# Patient Record
Sex: Female | Born: 1960 | Race: Black or African American | Hispanic: No | Marital: Married | Smoking: Never smoker
Health system: Southern US, Community
[De-identification: ages and names within clinical notes are randomized; demographics above are authoritative.]

## PROBLEM LIST (undated history)

## (undated) DIAGNOSIS — R011 Cardiac murmur, unspecified: Secondary | ICD-10-CM

## (undated) DIAGNOSIS — D649 Anemia, unspecified: Secondary | ICD-10-CM

## (undated) DIAGNOSIS — D219 Benign neoplasm of connective and other soft tissue, unspecified: Secondary | ICD-10-CM

## (undated) DIAGNOSIS — Z5189 Encounter for other specified aftercare: Secondary | ICD-10-CM

## (undated) HISTORY — DX: Cardiac murmur, unspecified: R01.1

## (undated) HISTORY — PX: TONSILLECTOMY: SUR1361

---

## 2006-11-19 ENCOUNTER — Other Ambulatory Visit: Admission: RE | Admit: 2006-11-19 | Discharge: 2006-11-19 | Payer: Self-pay | Admitting: Obstetrics and Gynecology

## 2006-11-19 ENCOUNTER — Encounter (INDEPENDENT_AMBULATORY_CARE_PROVIDER_SITE_OTHER): Payer: Self-pay | Admitting: Specialist

## 2006-11-19 ENCOUNTER — Ambulatory Visit: Payer: Self-pay | Admitting: Obstetrics and Gynecology

## 2006-11-21 ENCOUNTER — Ambulatory Visit (HOSPITAL_COMMUNITY): Admission: RE | Admit: 2006-11-21 | Discharge: 2006-11-21 | Payer: Self-pay | Admitting: Obstetrics and Gynecology

## 2006-11-28 ENCOUNTER — Ambulatory Visit (HOSPITAL_COMMUNITY): Admission: RE | Admit: 2006-11-28 | Discharge: 2006-11-28 | Payer: Self-pay | Admitting: Obstetrics & Gynecology

## 2006-12-26 ENCOUNTER — Ambulatory Visit: Payer: Self-pay | Admitting: Obstetrics and Gynecology

## 2008-10-05 ENCOUNTER — Ambulatory Visit: Payer: Self-pay | Admitting: Obstetrics and Gynecology

## 2008-10-05 LAB — CONVERTED CEMR LAB
HCT: 24.2 % — ABNORMAL LOW (ref 36.0–46.0)
MCV: 58.5 fL — ABNORMAL LOW (ref 78.0–100.0)
Platelets: 196 10*3/uL (ref 150–400)
RBC: 4.14 M/uL (ref 3.87–5.11)
RDW: 22.3 % — ABNORMAL HIGH (ref 11.5–15.5)

## 2008-10-06 ENCOUNTER — Observation Stay (HOSPITAL_COMMUNITY): Admission: AD | Admit: 2008-10-06 | Discharge: 2008-10-07 | Payer: Self-pay | Admitting: Obstetrics & Gynecology

## 2008-10-10 ENCOUNTER — Ambulatory Visit (HOSPITAL_COMMUNITY): Admission: RE | Admit: 2008-10-10 | Discharge: 2008-10-10 | Payer: Self-pay | Admitting: Obstetrics & Gynecology

## 2008-10-13 ENCOUNTER — Ambulatory Visit: Payer: Self-pay | Admitting: Obstetrics and Gynecology

## 2008-10-13 LAB — CONVERTED CEMR LAB
HCT: 34.7 % — ABNORMAL LOW (ref 36.0–46.0)
Platelets: 337 10*3/uL (ref 150–400)
RDW: 30.9 % — ABNORMAL HIGH (ref 11.5–15.5)
WBC: 8.7 10*3/uL (ref 4.0–10.5)

## 2008-10-27 ENCOUNTER — Ambulatory Visit (HOSPITAL_COMMUNITY): Admission: RE | Admit: 2008-10-27 | Discharge: 2008-10-27 | Payer: Self-pay | Admitting: Obstetrics and Gynecology

## 2008-10-27 ENCOUNTER — Ambulatory Visit: Payer: Self-pay | Admitting: Obstetrics and Gynecology

## 2009-06-05 ENCOUNTER — Emergency Department (HOSPITAL_COMMUNITY): Admission: EM | Admit: 2009-06-05 | Discharge: 2009-06-05 | Payer: Self-pay | Admitting: Family Medicine

## 2010-04-12 ENCOUNTER — Ambulatory Visit: Payer: Self-pay | Admitting: Obstetrics and Gynecology

## 2010-04-13 ENCOUNTER — Encounter: Payer: Self-pay | Admitting: Obstetrics and Gynecology

## 2010-04-13 LAB — CONVERTED CEMR LAB
HCT: 24.5 % — ABNORMAL LOW (ref 36.0–46.0)
Hemoglobin: 5.7 g/dL — CL (ref 12.0–15.0)
Platelets: 325 10*3/uL (ref 150–400)
RBC: 4.07 M/uL (ref 3.87–5.11)
WBC: 7.3 10*3/uL (ref 4.0–10.5)

## 2010-08-11 ENCOUNTER — Observation Stay (HOSPITAL_COMMUNITY)
Admission: AD | Admit: 2010-08-11 | Discharge: 2010-08-12 | Payer: Self-pay | Source: Home / Self Care | Admitting: Obstetrics & Gynecology

## 2010-08-27 ENCOUNTER — Ambulatory Visit: Payer: Self-pay | Admitting: Obstetrics and Gynecology

## 2010-09-13 ENCOUNTER — Encounter (INDEPENDENT_AMBULATORY_CARE_PROVIDER_SITE_OTHER): Payer: Self-pay | Admitting: *Deleted

## 2010-09-13 ENCOUNTER — Ambulatory Visit: Payer: Self-pay | Admitting: Obstetrics & Gynecology

## 2010-09-13 LAB — CONVERTED CEMR LAB
HCT: 34.7 % — ABNORMAL LOW (ref 36.0–46.0)
MCHC: 30.3 g/dL (ref 30.0–36.0)
MCV: 80.7 fL (ref 78.0–100.0)
Platelets: 226 10*3/uL (ref 150–400)

## 2011-02-07 LAB — CBC
HCT: 17.5 % — ABNORMAL LOW (ref 36.0–46.0)
MCH: 24.2 pg — ABNORMAL LOW (ref 26.0–34.0)
MCHC: 30.5 g/dL (ref 30.0–36.0)
MCV: 77 fL — ABNORMAL LOW (ref 78.0–100.0)
Platelets: 256 10*3/uL (ref 150–400)
RBC: 2.64 MIL/uL — ABNORMAL LOW (ref 3.87–5.11)
RBC: 3.67 MIL/uL — ABNORMAL LOW (ref 3.87–5.11)
RDW: 18.6 % — ABNORMAL HIGH (ref 11.5–15.5)
RDW: 26.2 % — ABNORMAL HIGH (ref 11.5–15.5)
WBC: 7.6 10*3/uL (ref 4.0–10.5)

## 2011-02-07 LAB — CROSSMATCH
ABO/RH(D): B POS
Antibody Screen: NEGATIVE

## 2011-02-07 LAB — HCG, SERUM, QUALITATIVE: Preg, Serum: NEGATIVE

## 2011-02-07 LAB — POCT PREGNANCY, URINE: Preg Test, Ur: NEGATIVE

## 2011-03-03 LAB — DIFFERENTIAL
Basophils Absolute: 0 10*3/uL (ref 0.0–0.1)
Eosinophils Absolute: 0.1 10*3/uL (ref 0.0–0.7)
Lymphocytes Relative: 25 % (ref 12–46)
Neutrophils Relative %: 65 % (ref 43–77)

## 2011-03-03 LAB — CBC
Platelets: 310 10*3/uL (ref 150–400)
RBC: 4.48 MIL/uL (ref 3.87–5.11)
RDW: 21.2 % — ABNORMAL HIGH (ref 11.5–15.5)
WBC: 5.9 10*3/uL (ref 4.0–10.5)

## 2011-03-03 LAB — D-DIMER, QUANTITATIVE: D-Dimer, Quant: 0.42 ug/mL-FEU (ref 0.00–0.48)

## 2011-03-21 ENCOUNTER — Ambulatory Visit (INDEPENDENT_AMBULATORY_CARE_PROVIDER_SITE_OTHER): Payer: Medicaid Other | Admitting: Obstetrics & Gynecology

## 2011-03-21 ENCOUNTER — Other Ambulatory Visit: Payer: Self-pay | Admitting: Obstetrics & Gynecology

## 2011-03-21 DIAGNOSIS — Z01818 Encounter for other preprocedural examination: Secondary | ICD-10-CM

## 2011-03-21 DIAGNOSIS — D259 Leiomyoma of uterus, unspecified: Secondary | ICD-10-CM

## 2011-03-21 DIAGNOSIS — D219 Benign neoplasm of connective and other soft tissue, unspecified: Secondary | ICD-10-CM

## 2011-03-21 DIAGNOSIS — N920 Excessive and frequent menstruation with regular cycle: Secondary | ICD-10-CM

## 2011-03-22 NOTE — Group Therapy Note (Signed)
NAME:  Katherine Harvey, SKEEN NO.:  [id number]  MEDICAL RECORD NO.:  [id number]           PATIENT TYPE:  A  LOCATION[facility]  [facility]                   FACILITY:  [facility]  PHYSICIAN:  Jaynie Collins, MD     DATE OF BIRTH:  1961/10/09  DATE OF SERVICE:  03/21/2011                                 CLINIC NOTE  REASON FOR VISIT:  Followup fibroids.  HISTORY OF PRESENT ILLNESS:  The patient is a 50 year old gravida 1, para 1 with a long history of dysfunctional uterine bleeding and uterine fibroids.  She was evaluated by Dr. Scheryl Darter in October 2011.  Prior to that visit in September, she did have an ultrasound which showed a 17- week size fibroid uterus, largest fibroid was noted to measure 7.3-cm and a left fundal one measured 5.5-cm.  The patient also was admitted for symptomatic anemia in September as she was transitioned 2 units of blood and started on [place].  At her last visit, the patient was counseled regarding hysterectomy given the size of her fibroids and her symptoms.  The patient did want to postpone surgery due to child care issues and wanted to continue on oral contraceptives.  At this visit, she does decide that she wants to proceed with surgery.  Initially, the patient did want to have a myomectomy and she was told that this is not indicated given her age.  She is done with childbearing and the size of her fibroids.  When we discussed different modalities of surgery and the risks and benefits of bilateral salpingo-oophorectomy and also discussed whether she wanted her cervix removed or not (the patient has no history of cervical dysplasia).  The patient decided that she wanted a supracervical hysterectomy without bilateral salpingo-oophorectomy.  The risks of surgery were reviewed with the patient including bleeding, infection, injury to surrounding organs and need for additional procedures.  All her questions were answered.  She was given an up-to- date  patient education pamphlet about abdominal hysterectomy.  The patient was told that it is very likely that the surgery might be done with a transverse incision, but if her uterine fibroids have gotten bigger, we might have to do a infraumbilical vertical incisions in order to help with reducing the fibroid size.  The patient was offered a Depo- Lupron which she accepted.  The risks of this were reviewed.  She was told to expect withdrawal bleeding, especially within the first 3 weeks on the Lupron, but she should continue her Seasonique for the first month and then discontinue her Seasonique for the last 2 months' as the Lupron will be in for 3 months.  If she does have a lot of menopausal symptoms on the Lupron, we could treat her at that point with a little bit of norethindrone 5 mg daily, but if not she will probably be done with her surgery around that time.  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  Today, we will check a CBC, a TSH and a prolactin level.  We will give her Depo-Lupron 11.25 mg and also schedule another ultrasound to see if her basically where we are going for a baseline from when we started Depo-Lupron.  We will see based on examination if we need to repeat imaging prior to surgery.  Of note, the patient did have an endometrial biopsy that was done in October that was benign.          ______________________________ Jaynie Collins, MD   UA/MEDQ  D:  03/21/2011  T:  03/22/2011  Job:  [id number]

## 2011-04-01 ENCOUNTER — Other Ambulatory Visit: Payer: Self-pay | Admitting: Obstetrics & Gynecology

## 2011-04-01 ENCOUNTER — Ambulatory Visit (HOSPITAL_COMMUNITY)
Admission: RE | Admit: 2011-04-01 | Discharge: 2011-04-01 | Disposition: A | Discharge status: Home / Self Care | Payer: Self-pay | Source: Ambulatory Visit | Attending: Obstetrics & Gynecology | Admitting: Obstetrics & Gynecology

## 2011-04-01 ENCOUNTER — Ambulatory Visit (HOSPITAL_COMMUNITY): Payer: Self-pay

## 2011-04-01 DIAGNOSIS — Z1231 Encounter for screening mammogram for malignant neoplasm of breast: Secondary | ICD-10-CM

## 2011-04-01 DIAGNOSIS — D259 Leiomyoma of uterus, unspecified: Secondary | ICD-10-CM | POA: Insufficient documentation

## 2011-04-01 DIAGNOSIS — D219 Benign neoplasm of connective and other soft tissue, unspecified: Secondary | ICD-10-CM

## 2011-04-01 DIAGNOSIS — N920 Excessive and frequent menstruation with regular cycle: Secondary | ICD-10-CM | POA: Insufficient documentation

## 2011-04-03 ENCOUNTER — Other Ambulatory Visit: Payer: Self-pay | Admitting: Diagnostic Radiology

## 2011-04-03 DIAGNOSIS — D219 Benign neoplasm of connective and other soft tissue, unspecified: Secondary | ICD-10-CM

## 2011-04-08 ENCOUNTER — Ambulatory Visit (HOSPITAL_COMMUNITY)
Admission: RE | Admit: 2011-04-08 | Discharge: 2011-04-08 | Disposition: A | Discharge status: Home / Self Care | Payer: Self-pay | Source: Ambulatory Visit | Attending: Obstetrics & Gynecology | Admitting: Obstetrics & Gynecology

## 2011-04-08 DIAGNOSIS — Z1231 Encounter for screening mammogram for malignant neoplasm of breast: Secondary | ICD-10-CM

## 2011-04-09 ENCOUNTER — Other Ambulatory Visit (HOSPITAL_COMMUNITY): Payer: Self-pay | Admitting: Diagnostic Radiology

## 2011-04-09 ENCOUNTER — Ambulatory Visit
Admission: RE | Admit: 2011-04-09 | Discharge: 2011-04-09 | Disposition: A | Discharge status: Home / Self Care | Payer: Medicaid Other | Source: Ambulatory Visit | Attending: Diagnostic Radiology | Admitting: Diagnostic Radiology

## 2011-04-09 VITALS — BP 139/59 | HR 59 | Temp 97.9°F | Resp 14 | Ht 67.5 in | Wt 234.0 lb

## 2011-04-09 DIAGNOSIS — D219 Benign neoplasm of connective and other soft tissue, unspecified: Secondary | ICD-10-CM

## 2011-04-09 NOTE — Group Therapy Note (Signed)
NAME:  Katherine Harvey, Katherine Harvey NO.:  [id number]   MEDICAL RECORD NO.:  [id number]          PATIENT TYPE:  WOC   [facility]ATION[facility]                   FACILITY:  [facility]   PHYSICIAN:  Argentina Donovan, MD        DATE OF BIRTH:  07-29-1961   DATE OF SERVICE:  10/13/2008                                  CLINIC NOTE   REASON FOR VISIT:  Katherine Harvey is here for a followup after  hospitalization where she received blood transfusion.   HISTORY OF PRESENT ILLNESS:  Katherine Harvey is a 50 year old gravida 1, para  1-0-0-1 who was recently admitted to the hospital after being found to  have a hemoglobin of 5.9.  She has a extensively long history of  fibroids and recently an ultrasound was notable for a 16-cm uterus with  multiple fibroids, the largest of which were 6 and 5 cm.  She has had  chronic dysfunctional uterine bleeding and most recently detected to  have a hemoglobin of 5.9.  She is admitted to the hospital and  transfused with 3 units of pack per blood cells and was discharged home  in good condition.  She presents today for followup.  She relates that  her bleeding is a bit less.  However, still present.  She has no  complaints of abdominal pain, dizziness, nausea, or vomiting.   PHYSICAL EXAMINATION:  Her abdomen is soft and nontender.  She has  normoactive bowel sounds.  Her uterus is palpable at approximately 2 cm  below the umbilicus and is nontender.  A pelvic exam was deferred today.   ASSESSMENT AND PLAN:  1. Dysfunctional uterine bleeding.  2. Fibroid uterus, likely the cause of dysfunctional uterine bleeding.   I had a lengthy discussion with this patient regarding her management  options to include Depo-Lupron versus total abdominal hysterectomy.  Given the extent of her fibroid disease, it is our recommendation that  she had the abdominal hysterectomy.  However, due to financial  considerations with her job as well as issues with transportation of her  child to  school, she desires to wait until the summer to have the  surgery performed.  Therefore, we discussed the Depo-Lupron shot and the  patient will fill in application to have the shot finances.  She will  have CBC drawn today to recheck hematocrit and will return for the Depo-  Lupron shot once the paperwork is approved.     ______________________________  Odie Sera, D.O.    ______________________________  Argentina Donovan, MD    MC/MEDQ  D:  10/13/2008  T:  10/14/2008  Job:  [id number]

## 2011-04-09 NOTE — Group Therapy Note (Signed)
NAME:  Katherine Harvey, Katherine Harvey NO.:  [id number]   MEDICAL RECORD NO.:  [id number]          PATIENT TYPE:  WOC   [facility]ATION[facility]                   [facility]ACILITY:  [facility]   PHYSICIAN:  Argentina Donovan, MD        DATE OF BIRTH:  11-27-1960   DATE OF SERVICE:  10/05/2008                                  CLINIC NOTE   The patient is a 50 year old gravida 1, para 1-0-0-1 who was seen  previously because of bleeding and fibroids.  She had an ultrasound that  measured 12 weeks' gestational size uterus which now feels much larger  to me.  She had endometrial biopsy 1 year ago that showed no hyperplasia  or malignancy identified.  She had [facility] and is bothered now by some dyspareunia and pelvic  pressure.   PHYSICAL EXAMINATION:  PELVIC:  Examination of the uterus now feels  about a 16-week size, markedly irregular.  External genitalia are  normal.  BUS within normal limits.  Vagina is pink and well rugated.  Cervix is clean and parous.   We are going to schedule another ultrasound and a mammogram and have her  come back for the results in 2 weeks.  I have counseled her to start  some iron therapy again, and we will get a CBC today.           ______________________________  Argentina Donovan, MD     PR/MEDQ  D:  10/05/2008  T:  10/05/2008  Job:  [id number]

## 2011-04-12 NOTE — Group Therapy Note (Signed)
NAME:  Katherine Harvey, Katherine Harvey NO.:  [id number]   MEDICAL RECORD NO.:  [id number]          PATIENT TYPE:  WOC   [facility]                   [facility]   PHYSICIAN:  Argentina Donovan, MD        DATE OF BIRTH:  11/25/1961   DATE OF SERVICE:  11/19/2006                                  CLINIC NOTE   The patient is a 50 year old black female, gravida 1, para 1-0-0-1, who  has had a 3-year history of abnormally heavy periods.  She recently went  after several years to the health department for her Pap smear, which  was normal, but at that time they did a hemoglobin which turned out to  be 6.1.  They put her on iron therapy and referred her to [place] for  evaluation.   At this time we saw the patient, the abdomen soft, flat, nontender.  No  masses, no organomegaly.  The uterus did not seem to be markedly  enlarged but was retroverted, and the external genitalia is normal.  BUS  within normal limits, the vagina clean and well-rugated.  The cervix was  clean and parous.  The uterus was sounded to a depth of 10 cm and an  endometrial biopsy was taken.   CBC is going to be followed up, as well as an ultrasound, and the  patient is going to come back in 2 weeks for [place] to review the  ultrasound, CBC and endometrial biopsy and decide on future therapy for  this patient.  The patient's low parity was voluntary, and she said she  was careful throughout her life not to get pregnant.  Other than that,  she is in good health, works a full-time job, and has no significant  other medical problems.           ______________________________  Argentina Donovan, MD     PR/MEDQ  D:  11/19/2006  T:  11/19/2006  Job:  [id number]

## 2011-04-12 NOTE — Group Therapy Note (Signed)
NAME:  Katherine Harvey, Katherine Harvey NO.:  [id number]   MEDICAL RECORD NO.:  [id number]          PATIENT TYPE:  WOC   [facility]                   [facility]   PHYSICIAN:  Argentina Donovan, MD        DATE OF BIRTH:  1961-10-12   DATE OF SERVICE:                                  CLINIC NOTE   The patient is a 50 year old, gravida 1, para 1-0-0-1, who had been  previously seen because of bleeding problems.  She has a uterus that on  ultrasound measured about 12 weeks' gestational size with several  significant leiomyomata, one measuring 6 and one 5-cm within the  myometrium that our changing the shape of the endometrial cavity.  At  the present time, she did not seem to be bleeding very heavily and has  only been having 5-day periods.  Her hemoglobin on the last check was 10  with a hematocrit of 38.  I suggested that she go on some iron  supplementation.  If she decides that she wants something done, that we  might go ahead and try an endometrial ablation before any kind of  indication severe enough to do a hysterectomy.  The patient had a recent  Pap smear which was normal.   IMPRESSION:  Occasional menometrorrhagia with uterine fibroids.           ______________________________  Argentina Donovan, MD     PR/MEDQ  D:  12/26/2006  T:  12/26/2006  Job:  [id number]

## 2011-04-13 ENCOUNTER — Other Ambulatory Visit (HOSPITAL_COMMUNITY): Payer: Medicaid Other

## 2011-05-14 ENCOUNTER — Encounter (HOSPITAL_COMMUNITY)
Admission: RE | Admit: 2011-05-14 | Discharge: 2011-05-14 | Disposition: A | Discharge status: Home / Self Care | Payer: Self-pay | Source: Ambulatory Visit | Attending: Obstetrics & Gynecology | Admitting: Obstetrics & Gynecology

## 2011-05-14 DIAGNOSIS — Z01812 Encounter for preprocedural laboratory examination: Secondary | ICD-10-CM | POA: Insufficient documentation

## 2011-05-14 DIAGNOSIS — Z01818 Encounter for other preprocedural examination: Secondary | ICD-10-CM | POA: Insufficient documentation

## 2011-05-14 LAB — CBC
MCH: 24.3 pg — ABNORMAL LOW (ref 26.0–34.0)
MCHC: 31.1 g/dL (ref 30.0–36.0)
MCV: 78.2 fL (ref 78.0–100.0)
Platelets: 161 10*3/uL (ref 150–400)

## 2011-05-14 LAB — TYPE AND SCREEN: ABO/RH(D): B POS

## 2011-05-21 ENCOUNTER — Ambulatory Visit (HOSPITAL_COMMUNITY): Admission: AD | Admit: 2011-05-21 | Payer: Self-pay | Source: Ambulatory Visit | Admitting: Obstetrics & Gynecology

## 2011-06-24 ENCOUNTER — Telehealth: Payer: Self-pay | Admitting: *Deleted

## 2011-06-24 NOTE — Telephone Encounter (Signed)
Call received from [facility] @ OB/Gyn office stating that the pre-op office stated that pt had cancelled her surgery for 06/27/11. She wanted to confirm info. I stated that we did not have any documentation to that effect and have not received any calls from Ms. Kees.  Rikki Spearing stated that she has left several messages for patient without any reply.

## 2011-06-27 ENCOUNTER — Encounter (HOSPITAL_COMMUNITY): Admission: RE | Payer: Self-pay | Source: Ambulatory Visit

## 2011-06-27 ENCOUNTER — Inpatient Hospital Stay (HOSPITAL_COMMUNITY): Admission: RE | Admit: 2011-06-27 | Payer: Self-pay | Source: Ambulatory Visit | Admitting: Obstetrics & Gynecology

## 2011-06-27 SURGERY — HYSTERECTOMY, SUPRACERVICAL, ABDOMINAL
Anesthesia: General

## 2011-08-27 LAB — CROSSMATCH: Antibody Screen: NEGATIVE

## 2012-03-10 ENCOUNTER — Telehealth: Payer: Self-pay | Admitting: *Deleted

## 2012-03-10 NOTE — Telephone Encounter (Signed)
I returned pt call and left message that her surgery was previously scheduled for 06/27/11 and our notes indicate that she had cancelled. If she is now interested in having the surgery she will need an appt with the doctor. It can not be re-scheduled without this appt because it has been almost a year since she has been seen. She may call for surgical consult appt.

## 2012-03-10 NOTE — Telephone Encounter (Signed)
Pt left message stating that her surgery for last June was cancelled and she would like to re-schedule the surgery for this June. Please call back.

## 2012-05-07 ENCOUNTER — Encounter: Payer: Self-pay | Admitting: Obstetrics & Gynecology

## 2012-05-07 ENCOUNTER — Ambulatory Visit (INDEPENDENT_AMBULATORY_CARE_PROVIDER_SITE_OTHER): Payer: Self-pay | Admitting: Obstetrics & Gynecology

## 2012-05-07 VITALS — BP 118/67 | HR 72 | Temp 98.0°F | Ht 68.0 in | Wt 232.1 lb

## 2012-05-07 DIAGNOSIS — Z1231 Encounter for screening mammogram for malignant neoplasm of breast: Secondary | ICD-10-CM

## 2012-05-07 DIAGNOSIS — D219 Benign neoplasm of connective and other soft tissue, unspecified: Secondary | ICD-10-CM | POA: Insufficient documentation

## 2012-05-07 DIAGNOSIS — D259 Leiomyoma of uterus, unspecified: Secondary | ICD-10-CM

## 2012-05-07 NOTE — Patient Instructions (Signed)
Fibroids You have been diagnosed as having a fibroid. Fibroids are smooth muscle lumps (tumors) which can occur any place in a woman's body. They are usually in the womb (uterus). The most common problem (symptom) of fibroids is bleeding. Over time this may cause low red blood cells (anemia). Other symptoms include feelings of pressure and pain in the pelvis. The diagnosis (learning what is wrong) of fibroids is made by physical exam. Sometimes tests such as an ultrasound are used. This is helpful when fibroids are felt around the ovaries and to look for tumors. TREATMENT   Most fibroids do not need surgical or medical treatment. Sometimes a tissue sample (biopsy) of the lining of the uterus is done to rule out cancer. If there is no cancer and only a small amount of bleeding, the problem can be watched.   Hormonal treatment can improve the problem.   When surgery is needed, it can consist of removing the fibroid. Vaginal birth may not be possible after the removal of fibroids. This depends on where they are and the extent of surgery. When pregnancy occurs with fibroids it is usually normal.   Your caregiver can help decide which treatments are best for you.  HOME CARE INSTRUCTIONS   Do not use aspirin as this may increase bleeding problems.   If your periods (menses) are heavy, record the number of pads or tampons used per month. Bring this information to your caregiver. This can help them determine the best treatment for you.  SEEK IMMEDIATE MEDICAL CARE IF:  You have pelvic pain or cramps not controlled with medications, or experience a sudden increase in pain.   You have an increase of pelvic bleeding between and during menses.   You feel lightheaded or have fainting spells.   You develop worsening belly (abdominal) pain.  Document Released: 11/08/2000 Document Revised: 10/31/2011 Document Reviewed: 06/30/2008 Exit[employer] Patient Information 2012 [employer].    

## 2012-05-07 NOTE — Progress Notes (Signed)
History:  51 y.o. G1P1001 here today for discussion about fibroids.  She was seen last in 2012 and was considering hysterectomy for menorrhagia.  Since then, her bleeding is less than before, she denies any pain. Just wants to make sure her fibroids are not getting too big. No other concerns.  The following portions of the patient's history were reviewed and updated as appropriate: allergies, current medications, past family history, past medical history, past social history, past surgical history and problem list.  Review of Systems:  Pertinent items are noted in HPI.  Objective:  Physical Exam Blood pressure 118/67, pulse 72, temperature 98 F (36.7 C), temperature source Oral, height 5\' 8"  (1.727 m), weight 232 lb 1.6 oz (105.28 kg), last menstrual period 04/23/2012. Gen: NAD Abd: Soft, NT, ND Pelvic: Normal appearing external genitalia; normal appearing vaginal mucosa and cervix.  20 week sized globular uterus on examination, no tenderness  Labs and Imaging 04/01/2011 TRANSABDOMINAL AND TRANSVAGINAL ULTRASOUND OF PELVIS  Uterus: Markedly enlarged, measuring at least 20.9 x 9.4 x 15.4 cm. Diffuse involvement by multiple uterine fibroids is seen. The largest fibroid is seen in the lower uterine segment measuring 10.4 cm in diameter. Multiple other fibroids measure between 3.0 cm and 4.9 cm in maximum diameter. Endometrium: Obscured by acoustic shadowing from multiple fibroids described above the. Right ovary: not directly visualized by transabdominal or transvaginal sonography, however no adnexal mass identified. Left ovary: 3.6 x 2.1 x 2.1 cm. Normal appearance/no adnexal mass. Other findings: No free fluid.  IMPRESSION: 1. Markedly enlarged uterus with diffuse involvement by multiple fibroids. Overall uterine size has mildly increased since previous study. 2. No adnexal mass identified.  Assessment & Plan:  Will get repeat ultrasound Patient to return to discuss results and to have annual  exam; mammogram scheduled  Pain and bleeding precautions reviewed

## 2012-05-11 ENCOUNTER — Ambulatory Visit (HOSPITAL_COMMUNITY)
Admission: RE | Admit: 2012-05-11 | Discharge: 2012-05-11 | Disposition: A | Discharge status: Home / Self Care | Payer: Self-pay | Source: Ambulatory Visit | Attending: Obstetrics & Gynecology | Admitting: Obstetrics & Gynecology

## 2012-05-11 DIAGNOSIS — N938 Other specified abnormal uterine and vaginal bleeding: Secondary | ICD-10-CM | POA: Insufficient documentation

## 2012-05-11 DIAGNOSIS — D219 Benign neoplasm of connective and other soft tissue, unspecified: Secondary | ICD-10-CM

## 2012-05-11 DIAGNOSIS — N949 Unspecified condition associated with female genital organs and menstrual cycle: Secondary | ICD-10-CM | POA: Insufficient documentation

## 2012-05-11 DIAGNOSIS — D259 Leiomyoma of uterus, unspecified: Secondary | ICD-10-CM | POA: Insufficient documentation

## 2012-06-01 ENCOUNTER — Ambulatory Visit (HOSPITAL_COMMUNITY)
Admission: RE | Admit: 2012-06-01 | Discharge: 2012-06-01 | Disposition: A | Discharge status: Home / Self Care | Payer: Self-pay | Source: Ambulatory Visit | Attending: Obstetrics & Gynecology | Admitting: Obstetrics & Gynecology

## 2012-06-01 DIAGNOSIS — Z1231 Encounter for screening mammogram for malignant neoplasm of breast: Secondary | ICD-10-CM | POA: Insufficient documentation

## 2012-06-03 ENCOUNTER — Other Ambulatory Visit: Payer: Self-pay | Admitting: Obstetrics & Gynecology

## 2012-06-03 ENCOUNTER — Telehealth: Payer: Self-pay | Admitting: *Deleted

## 2012-06-03 DIAGNOSIS — R928 Other abnormal and inconclusive findings on diagnostic imaging of breast: Secondary | ICD-10-CM

## 2012-06-03 NOTE — Telephone Encounter (Signed)
Patient informed of mammogram results. She is not insured. I spoke with [place] with [facility], she said that patient may qualify for the [place] to get a diagnostic mammogram. I will send patients information to [place] today and she will fill out form and fax it for the patient. [facility] for [facility]. Pt voiced understanding.

## 2012-06-03 NOTE — Telephone Encounter (Signed)
Message copied by Mannie Stabile on Wed Jun 03, 2012  8:57 AM ------      Message from: Jaynie Collins A      Created: Wed Jun 03, 2012  8:40 AM       Please ensure patient has follow up appointments in [facility].  Can she go to [facility] as she has no insurance?

## 2012-06-11 ENCOUNTER — Ambulatory Visit
Admission: RE | Admit: 2012-06-11 | Discharge: 2012-06-11 | Disposition: A | Discharge status: Home / Self Care | Payer: No Typology Code available for payment source | Source: Ambulatory Visit | Attending: Obstetrics & Gynecology | Admitting: Obstetrics & Gynecology

## 2012-06-11 DIAGNOSIS — R928 Other abnormal and inconclusive findings on diagnostic imaging of breast: Secondary | ICD-10-CM

## 2012-06-16 ENCOUNTER — Telehealth: Payer: Self-pay | Admitting: *Deleted

## 2012-06-16 NOTE — Telephone Encounter (Signed)
Message copied by [name] on Tue Jun 16, 2012  3:38 PM ------      Message from: Katherine Harvey A      Created: Thu Jun 11, 2012  4:56 PM       Needs left breast ultrasound in six months. Please schedule and call patient with appointment.

## 2012-06-16 NOTE — Telephone Encounter (Signed)
Called pt and discussed the need for repeat Lt breast US in 6 mos. Pt states she is aware- was informed by [facility]. I advised pt that if she has not received a call from the [facility] by January 2014, she should call them to schedule her test.  Pt also stated that she is waiting to hear about her surgery date. Upon review of Dr. Mont Dutton notes, pt is to return for annual exam, review repeat pelvic US results, then have surgery scheduled. Pt agreed and would like to have clinic appt for the previously mentioned reasons. I stated that one of the scheduling staff can call her back tomorrow w/appt info. Pt agreed and voiced understanding. She syayed that a message can be left on her voice mail and that she will take the first available appt with any surgeon on a Monday afternoon.

## 2012-07-13 ENCOUNTER — Encounter: Payer: Self-pay | Admitting: Obstetrics & Gynecology

## 2012-07-13 ENCOUNTER — Ambulatory Visit (INDEPENDENT_AMBULATORY_CARE_PROVIDER_SITE_OTHER): Payer: Self-pay | Admitting: Obstetrics & Gynecology

## 2012-07-13 VITALS — BP 123/65 | HR 86 | Temp 98.3°F | Ht 68.0 in | Wt 234.2 lb

## 2012-07-13 DIAGNOSIS — D219 Benign neoplasm of connective and other soft tissue, unspecified: Secondary | ICD-10-CM

## 2012-07-13 DIAGNOSIS — D259 Leiomyoma of uterus, unspecified: Secondary | ICD-10-CM

## 2012-07-13 NOTE — Patient Instructions (Signed)
Return to clinic for any scheduled appointments or for any gynecologic concerns as needed.   

## 2012-07-13 NOTE — Progress Notes (Signed)
History:  51 y.o. G1P1001 here today for follow up ultrasound results. Reports decreased menstrual bleeding, no pain. No other concerns.   The following portions of the patient's history were reviewed and updated as appropriate: allergies, current medications, past family history, past medical history, past social history, past surgical history and problem list.  Review of Systems:  Pertinent items are noted in HPI.  Objective:  Physical Exam Blood pressure 123/65, pulse 86, temperature 98.3 F (36.8 C), temperature source Oral, height 5\' 8"  (1.727 m), weight 234 lb 3.2 oz (106.232 kg), last menstrual period 07/11/2012. Deferred  Labs and Imaging 05/11/2012  TRANSABDOMINAL AND TRANSVAGINAL ULTRASOUND OF PELVIS  Uterus: The uterus is 16.6 x 8.6 x 12.4 cm. Multiple fibroids are identified. The largest measure 10.8 x 8.3 x 8.6 cm in the lower uterus and 4.2 x 3.0 x 3.0 cm in the fundus. Endometrium: The endometrium is not visualized because of large fibroids. Right ovary: The right ovary is not seen because of fibroids. Left ovary: The left ovary is not seen because of fibroids. Other findings: No free fluid IMPRESSION: 1. Enlarged uterus containing multiple fibroids. 2. The endometrium is obscured by fibroids. 3. Although there is no evidence for adnexal mass, the ovaries are obscured by fibroids.  04/01/2011 TRANSABDOMINAL AND TRANSVAGINAL ULTRASOUND OF PELVIS Uterus: Markedly enlarged, measuring at least 20.9 x 9.4 x 15.4 cm. Diffuse involvement by multiple uterine fibroids is seen. The largest fibroid is seen in the lower uterine segment measuring 10.4 cm in diameter. Multiple other fibroids measure between 3.0 cm and 4.9 cm in maximum diameter. Endometrium: Obscured by acoustic shadowing from multiple fibroids described above the. Right ovary: not directly visualized by transabdominal or transvaginal sonography, however no adnexal mass identified. Left ovary: 3.6 x 2.1 x 2.1 cm. Normal appearance/no  adnexal mass. Other findings: No free fluid.  IMPRESSION: 1. Markedly enlarged uterus with diffuse involvement by multiple fibroids. Overall uterine size has mildly increased since previous study. 2. No adnexal mass identified.  Assessment & Plan:  Uterus is smaller compared to last year, decreased bleeding. No urgent indication for surgery. Patient reassured.  Informed about free cervical cancer screening opportunities that are coming up as she needs a pap smear. Patient is also [facility] followed by the [facility] for [facility] breast [facility], will continue to follow. Bleeding precautions emphasized.

## 2012-09-10 ENCOUNTER — Emergency Department (INDEPENDENT_AMBULATORY_CARE_PROVIDER_SITE_OTHER)
Admission: EM | Admit: 2012-09-10 | Discharge: 2012-09-10 | Disposition: A | Discharge status: Home / Self Care | Payer: Self-pay | Source: Home / Self Care | Attending: Emergency Medicine | Admitting: Emergency Medicine

## 2012-09-10 ENCOUNTER — Observation Stay (HOSPITAL_COMMUNITY)
Admission: EM | Admit: 2012-09-10 | Discharge: 2012-09-12 | Disposition: A | Discharge status: Home / Self Care | Payer: Self-pay | Attending: Internal Medicine | Admitting: Internal Medicine

## 2012-09-10 ENCOUNTER — Encounter (HOSPITAL_COMMUNITY): Payer: Self-pay | Admitting: *Deleted

## 2012-09-10 DIAGNOSIS — L02419 Cutaneous abscess of limb, unspecified: Secondary | ICD-10-CM | POA: Insufficient documentation

## 2012-09-10 DIAGNOSIS — R5381 Other malaise: Secondary | ICD-10-CM | POA: Insufficient documentation

## 2012-09-10 DIAGNOSIS — I82819 Embolism and thrombosis of superficial veins of unspecified lower extremities: Secondary | ICD-10-CM | POA: Insufficient documentation

## 2012-09-10 DIAGNOSIS — D649 Anemia, unspecified: Principal | ICD-10-CM | POA: Diagnosis present

## 2012-09-10 DIAGNOSIS — I10 Essential (primary) hypertension: Secondary | ICD-10-CM | POA: Diagnosis present

## 2012-09-10 DIAGNOSIS — D219 Benign neoplasm of connective and other soft tissue, unspecified: Secondary | ICD-10-CM

## 2012-09-10 DIAGNOSIS — R011 Cardiac murmur, unspecified: Secondary | ICD-10-CM | POA: Insufficient documentation

## 2012-09-10 DIAGNOSIS — I82409 Acute embolism and thrombosis of unspecified deep veins of unspecified lower extremity: Secondary | ICD-10-CM

## 2012-09-10 DIAGNOSIS — D259 Leiomyoma of uterus, unspecified: Secondary | ICD-10-CM | POA: Insufficient documentation

## 2012-09-10 DIAGNOSIS — R609 Edema, unspecified: Secondary | ICD-10-CM | POA: Insufficient documentation

## 2012-09-10 DIAGNOSIS — L0390 Cellulitis, unspecified: Secondary | ICD-10-CM | POA: Diagnosis present

## 2012-09-10 DIAGNOSIS — Z79899 Other long term (current) drug therapy: Secondary | ICD-10-CM | POA: Insufficient documentation

## 2012-09-10 DIAGNOSIS — Z7982 Long term (current) use of aspirin: Secondary | ICD-10-CM | POA: Insufficient documentation

## 2012-09-10 HISTORY — DX: Anemia, unspecified: D64.9

## 2012-09-10 HISTORY — DX: Benign neoplasm of connective and other soft tissue, unspecified: D21.9

## 2012-09-10 HISTORY — DX: Encounter for other specified aftercare: Z51.89

## 2012-09-10 LAB — POCT I-STAT, CHEM 8
Creatinine, Ser: 1 mg/dL (ref 0.50–1.10)
HCT: 26 % — ABNORMAL LOW (ref 36.0–46.0)
Hemoglobin: 8.8 g/dL — ABNORMAL LOW (ref 12.0–15.0)
Potassium: 3.6 mEq/L (ref 3.5–5.1)
Sodium: 142 mEq/L (ref 135–145)
TCO2: 23 mmol/L (ref 0–100)

## 2012-09-10 LAB — CBC WITH DIFFERENTIAL/PLATELET
Basophils Relative: 1 % (ref 0–1)
Eosinophils Absolute: 0.2 10*3/uL (ref 0.0–0.7)
Eosinophils Relative: 2 % (ref 0–5)
Hemoglobin: 6.6 g/dL — CL (ref 12.0–15.0)
Lymphs Abs: 1.7 10*3/uL (ref 0.7–4.0)
MCH: 16.4 pg — ABNORMAL LOW (ref 26.0–34.0)
MCHC: 24.9 g/dL — ABNORMAL LOW (ref 30.0–36.0)
MCV: 65.8 fL — ABNORMAL LOW (ref 78.0–100.0)
Monocytes Absolute: 0.6 10*3/uL (ref 0.1–1.0)
Neutro Abs: 6.5 10*3/uL (ref 1.7–7.7)
Neutrophils Relative %: 71 % (ref 43–77)
RBC: 4.03 MIL/uL (ref 3.87–5.11)

## 2012-09-10 LAB — OCCULT BLOOD, POC DEVICE: Fecal Occult Bld: NEGATIVE

## 2012-09-10 LAB — PREPARE RBC (CROSSMATCH)

## 2012-09-10 LAB — RETICULOCYTES
Retic Count, Absolute: 250.9 10*3/uL — ABNORMAL HIGH (ref 19.0–186.0)
Retic Ct Pct: 6.5 % — ABNORMAL HIGH (ref 0.4–3.1)

## 2012-09-10 NOTE — ED Notes (Signed)
Pt reports leg pain & swelling that has been there since 10/11 - warm, swollen (+2 edema), tender to touch

## 2012-09-10 NOTE — ED Notes (Signed)
Hemoccult negative. Performed by MD

## 2012-09-10 NOTE — ED Notes (Addendum)
C/o R leg swelling, has been that way for awhile (yrs), noticed change ~ 1 week ago ("worse", warm, larger, painful, tender). "did not pay attention to it b/c mother had same issues". Seen at [facility] tonight and sent here for concern of cellulitis or DVT. "Hgb noted to be low 6.6" and d-dimer elevated 0.63" (labs done PTA at [facility]), pt is a waitress, "on her feet all the time". CMS intact. RLE warm to touch. WBC normal PTA. Also mentions some sob and fatigue. Alert, NAD, calm, interactive, skin W&D, resps e/u, speaking in clear complete sentences.

## 2012-09-10 NOTE — ED Notes (Signed)
Main lab called to report critical lab value:  6.6 Hemoglobin; Dr. Lorenz Coaster made aware

## 2012-09-10 NOTE — H&P (Signed)
PCP:  none   Chief Complaint:   Abnormal labs  HPI: Katherine Harvey is a 51 y.o. female   has a past medical history of Heart murmur; Anemia; Blood transfusion without reported diagnosis; and Fibroids.   Presented with  1 week of right leg swelling redness and warmth and now pain as well. No fever no chills. She have had similar problems in this leg in the past and was treated successfully with antibiotics. For the past few weeks she have had shortness of breath and feeling light headed. Today she have had some heartburn like chest pains. She presented to Urgent care and was found to have anemia with hg down to 6.5 . She hs hx of fibroids and heavy menses with recurrent anemia requiring blood transfusions in the past. She was supposed to have hysterectomy but it was delayed. And now her fibroids have been improving.   Review of Systems:    Pertinent positives include: fatigue, black stools but she is on chronic iron, chest pain,  heartburn, dyspnea on exertion,  Constitutional:  No weight loss, night sweats, Fevers, chills,weight loss  HEENT:  No headaches, Difficulty swallowing,Tooth/dental problems,Sore throat,  No sneezing, itching, ear ache, nasal congestion, post nasal drip,  Cardio-vascular:  No Orthopnea, PND, anasarca, dizziness, palpitations.no Bilateral lower extremity swelling  GI:  No indigestion, abdominal pain, nausea, vomiting, diarrhea, change in bowel habits, loss of appetite, melena, blood in stool, hematemesis Resp:  no shortness of breath at rest. No No excess mucus, no productive cough, No non-productive cough, No coughing up of blood.No change in color of mucus.No wheezing. Skin:  no rash or lesions. No jaundice GU:  no dysuria, change in color of urine, no urgency or frequency. No straining to urinate.  No flank pain.  Musculoskeletal:  No joint pain or no joint swelling. No decreased range of motion. No back pain.  Psych:  No change in mood or affect. No  depression or anxiety. No memory loss.  Neuro: no localizing neurological complaints, no tingling, no weakness, no double vision, no gait abnormality, no slurred speech, no confusion  Otherwise ROS are negative except for above, 10 systems were reviewed  Past Medical History: Past Medical History  Diagnosis Date  . Heart murmur   . Anemia   . Blood transfusion without reported diagnosis   . Fibroids    History reviewed. No pertinent past surgical history.   Medications: Prior to Admission medications   Medication Sig Start Date End Date Taking? Authorizing Provider  aspirin EC 81 MG tablet Take 81 mg by mouth daily.   Yes Historical Provider, MD  calcium-vitamin D 250-100 MG-UNIT per tablet Take 1 tablet by mouth 2 (two) times daily.   Yes Historical Provider, MD  diphenhydrAMINE (BENADRYL) 25 MG tablet Take 25 mg by mouth every 6 (six) hours as needed. For allergies   Yes Historical Provider, MD  ferrous fumarate (HEMOCYTE - 106 MG FE) 325 (106 FE) MG TABS Take 1 tablet by mouth.   Yes Historical Provider, MD  Multiple Vitamin (MULTIVITAMIN) capsule Take 1 capsule by mouth daily.   Yes Historical Provider, MD    Allergies:  No Known Allergies  Social History:  Ambulatory   independently    Lives at  home   reports that she has never smoked. She has never used smokeless tobacco. She reports that she does not drink alcohol or use illicit drugs.   Family History: family history includes Heart disease in her mother.  Physical Exam: Patient Vitals for the past 24 hrs:  BP Temp Temp src Pulse Resp SpO2  09/10/12 1949 142/51 mmHg 98.6 F (37 C) Oral 97  20  99 %  09/10/12 1930 171/59 mmHg 98.4 F (36.9 C) Oral 110  20  100 %    1. General:  in No Acute distress 2. Psychological: Alert and  Oriented 3. Head/ENT:   Moist  Mucous Membranes                          Head Non traumatic, neck supple                          Normal   Dentition 4. SKIN:   decreased Skin  turgor,  Skin clean Dry and intact no rash 5. Heart: Regular rate and rhythm 2+  Murmur noted, no Rub or gallop 6. Lungs: Clear to auscultation bilaterally, no wheezes or crackles   7. Abdomen: Soft, non-tender, Non distended 8. Lower extremities: no clubbing, cyanosis, or edema on left, right leg appears to have well demarkated are or swelling and rednsess and warmth 9. Neurologically Grossly intact, moving all 4 extremities equally 10. MSK: Normal range of motion  body mass index is unknown because there is no height or weight on file.   Labs on Admission:   [facility] 09/10/12 2101  NA 142  K 3.6  CL 107  CO2 --  GLUCOSE 122*  BUN 12  CREATININE 1.00  CALCIUM --  MG --  PHOS --   No results found for this basename: AST:2,ALT:2,ALKPHOS:2,BILITOT:2,PROT:2,ALBUMIN:2 in the last 72 hours No results found for this basename: LIPASE:2,AMYLASE:2 in the last 72 hours  Basename 09/10/12 2101 09/10/12 1615  WBC -- 9.1  NEUTROABS -- 6.5  HGB 8.8* 6.6*  HCT 26.0* 26.5*  MCV -- 65.8*  PLT -- 310   No results found for this basename: CKTOTAL:3,CKMB:3,CKMBINDEX:3,TROPONINI:3 in the last 72 hours No results found for this basename: TSH,T4TOTAL,FREET3,T3FREE,THYROIDAB in the last 72 hours No results found for this basename: VITAMINB12:2,FOLATE:2,FERRITIN:2,TIBC:2,IRON:2,RETICCTPCT:2 in the last 72 hours No results found for this basename: HGBA1C    The CrCl is unknown because both a height and weight (above a minimum accepted value) are required for this calculation. ABG    Component Value Date/Time   TCO2 23 09/10/2012 2101     Lab Results  Component Value Date   DDIMER 0.63* 09/10/2012       Cultures: No results found for this basename: sdes, specrequest, cult, reptstatus       Radiological Exams on Admission: No results found.  Chart has been reviewed  Assessment/Plan  51 yo female with history of recurrent anemia secondary to fibroids as well as history of  cellulitis affecting her right leg Presents with anemia but hemoglobin found at urgent care to be 6.6. As well as swollen red right leg  Present on Admission:  .Anemia  - will transfuse 2 units of packed red blood cells after obtaining anemia panel patient has been symptomatic Hemoccult negative likely etiology is vaginal bleeding from fibroids. She would need to be followed up with her OB/GYN after discharge.  .Hypertension - stable  .Cellulitis - we'll cover for now the vancomycin since this is recurrent problem. Will obtain Dopplers of lower extremity but I suspect cellulitis other than DVT    Prophylaxis: SCD  Protonix  CODE STATUS: FULL CODE  Other plan as per orders.  I have spent a total of 55 min on this admission  Priscila Bean 09/10/2012, 11:10 PM

## 2012-09-10 NOTE — ED Provider Notes (Addendum)
History     CSN: [id number]  Arrival date & time 09/10/12  1927   First MD Initiated Contact with Patient 09/10/12 2221      Chief Complaint  Patient presents with  . Leg Swelling    (Consider location/radiation/quality/duration/timing/severity/associated sxs/prior treatment) The history is provided by the patient.   patient was sent in for urgent care with swelling of her right lower leg and anemia of 6.6. She's had some swelling in her leg the last week. She states she's been previously treated for cellulitis. D-dimer was done at urgent care and was elevated. She states she's on the field and time. His been no trauma. She's also had some shortness of breath and fatigue. She states she gets like this when her hemoglobin was low. She's previously had heavy periods. No chest pain. No fevers. She's post MI and was states that she takes irregularly.  Past Medical History  Diagnosis Date  . Heart murmur   . Anemia   . Blood transfusion without reported diagnosis   . Fibroids     History reviewed. No pertinent past surgical history.  Family History  Problem Relation Age of Onset  . Heart disease Mother     History  Substance Use Topics  . Smoking status: Never Smoker   . Smokeless tobacco: Never Used  . Alcohol Use: No    OB History    Grav Para Term Preterm Abortions TAB SAB Ect Mult Living   1 1 1  0 0 0 0 0 0 1      Review of Systems  Constitutional: Positive for fatigue. Negative for activity change and appetite change.  HENT: Negative for neck stiffness.   Eyes: Negative for pain.  Respiratory: Negative for chest tightness and shortness of breath.   Cardiovascular: Negative for chest pain and leg swelling.  Gastrointestinal: Negative for nausea, vomiting, abdominal pain and diarrhea.  Genitourinary: Negative for flank pain and vaginal bleeding.  Musculoskeletal: Negative for back pain.  Skin: Negative for rash.  Neurological: Negative for weakness, numbness and  headaches.  Psychiatric/Behavioral: Negative for behavioral problems.    Allergies  Review of patient's allergies indicates no known allergies.  Home Medications   Current Outpatient Rx  Name Route Sig Dispense Refill  . ASPIRIN EC 81 MG PO TBEC Oral Take 81 mg by mouth daily.    Marland Kitchen CALCIUM CITRATE-VITAMIN D 250-100 MG-UNIT PO TABS Oral Take 1 tablet by mouth 2 (two) times daily.    Marland Kitchen DIPHENHYDRAMINE HCL 25 MG PO TABS Oral Take 25 mg by mouth every 6 (six) hours as needed. For allergies    . FERROUS FUMARATE 325 (106 FE) MG PO TABS Oral Take 1 tablet by mouth.    . MULTIVITAMINS PO CAPS Oral Take 1 capsule by mouth daily.      BP 142/51  Pulse 88  Temp 98.6 F (37 C) (Oral)  Resp 21  SpO2 99%  LMP 09/06/2012  Physical Exam  Constitutional: She appears well-developed and well-nourished.  HENT:  Head: Normocephalic.  Eyes: Pupils are equal, round, and reactive to light.  Cardiovascular: Normal rate.   Pulmonary/Chest: Effort normal and breath sounds normal.  Musculoskeletal:       Right lower extremity edema. Tenderness from mid lower leg down. There is some erythema. Sensation and pulses intact  Skin: There is pallor.    ED Course  Procedures (including critical care time)  Labs Reviewed  POCT I-STAT, CHEM 8 - Abnormal; Notable for the following:  Glucose, Bld 122 (*)     Calcium, Ion 1.32 (*)     Hemoglobin 8.8 (*)     HCT 26.0 (*)     All other components within normal limits  TYPE AND SCREEN  ABO/RH  PREPARE RBC (CROSSMATCH)  VITAMIN B12  FOLATE  IRON AND TIBC  FERRITIN  RETICULOCYTES   No results found.   1. Anemia   2. Edema       MDM  Patient sent down from urgent care with lower extremity swelling and positive d-dimer. Also an anemia. History of anemia and is on iron. Some tachycardia. Patient be admitted to medicine for ultrasound and for blood transfusion.      Date: 09/10/2012  Rate: 84  Rhythm: normal sinus rhythm  QRS Axis:  normal  Intervals: normal  ST/T Wave abnormalities: nonspecific T wave changes  Conduction Disutrbances:none  Narrative Interpretation:   Old EKG Reviewed: none available     Juliet Rude. Rubin Payor, MD 09/10/12 1610  Juliet Rude. Rubin Payor, MD 09/10/12 [phone]

## 2012-09-10 NOTE — ED Notes (Signed)
H/o anemia, hgb 6.6 tonight, last blood transfusion 2-3 yrs ago, no h/o reaction, informed consent signed, on chart (with labels).

## 2012-09-11 ENCOUNTER — Encounter (HOSPITAL_COMMUNITY): Payer: Self-pay | Admitting: *Deleted

## 2012-09-11 DIAGNOSIS — M79609 Pain in unspecified limb: Secondary | ICD-10-CM

## 2012-09-11 DIAGNOSIS — M7989 Other specified soft tissue disorders: Secondary | ICD-10-CM

## 2012-09-11 LAB — CBC
HCT: 29.5 % — ABNORMAL LOW (ref 36.0–46.0)
MCH: 18.5 pg — ABNORMAL LOW (ref 26.0–34.0)
MCH: 18.9 pg — ABNORMAL LOW (ref 26.0–34.0)
MCHC: 27.2 g/dL — ABNORMAL LOW (ref 30.0–36.0)
MCHC: 28.1 g/dL — ABNORMAL LOW (ref 30.0–36.0)
MCV: 67.8 fL — ABNORMAL LOW (ref 78.0–100.0)
Platelets: 258 10*3/uL (ref 150–400)
Platelets: 262 10*3/uL (ref 150–400)
RBC: 4.24 MIL/uL (ref 3.87–5.11)
RDW: 26.9 % — ABNORMAL HIGH (ref 11.5–15.5)
RDW: 26.8 % — ABNORMAL HIGH (ref 11.5–15.5)
WBC: 7.9 10*3/uL (ref 4.0–10.5)

## 2012-09-11 LAB — IRON AND TIBC

## 2012-09-11 LAB — COMPREHENSIVE METABOLIC PANEL
ALT: 9 U/L (ref 0–35)
AST: 15 U/L (ref 0–37)
Albumin: 3 g/dL — ABNORMAL LOW (ref 3.5–5.2)
Alkaline Phosphatase: 58 U/L (ref 39–117)
Chloride: 107 mEq/L (ref 96–112)
Potassium: 3.7 mEq/L (ref 3.5–5.1)
Total Bilirubin: 0.3 mg/dL (ref 0.3–1.2)

## 2012-09-11 LAB — PHOSPHORUS: Phosphorus: 3.5 mg/dL (ref 2.3–4.6)

## 2012-09-11 LAB — TSH: TSH: 2.093 u[IU]/mL (ref 0.350–4.500)

## 2012-09-11 LAB — VITAMIN B12: Vitamin B-12: 426 pg/mL (ref 211–911)

## 2012-09-11 LAB — OCCULT BLOOD, POC DEVICE: Fecal Occult Bld: NEGATIVE

## 2012-09-11 MED ORDER — FERROUS FUMARATE 325 (106 FE) MG PO TABS
1.0000 | ORAL_TABLET | Freq: Every day | ORAL | Status: DC
  Administered 2012-09-11 – 2012-09-12 (×2): 106 mg via ORAL
  Filled 2012-09-11 (×2): qty 1

## 2012-09-11 MED ORDER — ONDANSETRON HCL 4 MG/2ML IJ SOLN: 4.0000 mg | Freq: Four times a day (QID) | INTRAMUSCULAR | Status: DC | PRN

## 2012-09-11 MED ORDER — DIPHENHYDRAMINE HCL 25 MG PO TABS
25.0000 mg | ORAL_TABLET | Freq: Four times a day (QID) | ORAL | Status: DC | PRN
  Filled 2012-09-11: qty 1

## 2012-09-11 MED ORDER — SODIUM CHLORIDE 0.9 % IV SOLN
INTRAVENOUS | Status: AC
  Administered 2012-09-11: 04:00:00 via INTRAVENOUS

## 2012-09-11 MED ORDER — DIPHENHYDRAMINE HCL 25 MG PO CAPS: 25.0000 mg | ORAL_CAPSULE | Freq: Four times a day (QID) | ORAL | Status: DC | PRN

## 2012-09-11 MED ORDER — ACETAMINOPHEN 325 MG PO TABS: 650.0000 mg | ORAL_TABLET | Freq: Four times a day (QID) | ORAL | Status: DC | PRN

## 2012-09-11 MED ORDER — ONDANSETRON HCL 4 MG PO TABS: 4.0000 mg | ORAL_TABLET | Freq: Four times a day (QID) | ORAL | Status: DC | PRN

## 2012-09-11 MED ORDER — HYDROCODONE-ACETAMINOPHEN 5-325 MG PO TABS: 1.0000 | ORAL_TABLET | ORAL | Status: DC | PRN

## 2012-09-11 MED ORDER — VANCOMYCIN HCL IN DEXTROSE 1-5 GM/200ML-% IV SOLN
1000.0000 mg | Freq: Two times a day (BID) | INTRAVENOUS | Status: DC
  Administered 2012-09-11 – 2012-09-12 (×3): 1000 mg via INTRAVENOUS
  Filled 2012-09-11 (×4): qty 200

## 2012-09-11 MED ORDER — SODIUM CHLORIDE 0.9 % IJ SOLN
3.0000 mL | Freq: Two times a day (BID) | INTRAMUSCULAR | Status: DC
  Administered 2012-09-11: 3 mL via INTRAVENOUS

## 2012-09-11 MED ORDER — ACETAMINOPHEN 650 MG RE SUPP: 650.0000 mg | Freq: Four times a day (QID) | RECTAL | Status: DC | PRN

## 2012-09-11 MED ORDER — DOCUSATE SODIUM 100 MG PO CAPS
100.0000 mg | ORAL_CAPSULE | Freq: Two times a day (BID) | ORAL | Status: DC
  Administered 2012-09-11: 100 mg via ORAL
  Filled 2012-09-11 (×4): qty 1

## 2012-09-11 MED ORDER — VANCOMYCIN HCL 1000 MG IV SOLR
1500.0000 mg | Freq: Once | INTRAVENOUS | Status: AC
  Administered 2012-09-11: 1500 mg via INTRAVENOUS
  Filled 2012-09-11: qty 1500

## 2012-09-11 NOTE — ED Notes (Signed)
Report called to [phone] RN 

## 2012-09-11 NOTE — Progress Notes (Signed)
Pt admitted to the unit. Pt is alert and oriented. Pt oriented to room, staff, and call bell. Bed in lowest position. Full assessment to Epic. Call bell with in reach. Told to call for assists. Will continue to monitor.  Katherine Harvey E  

## 2012-09-11 NOTE — ED Provider Notes (Signed)
Chief Complaint  Patient presents with  . Leg Pain  . Leg Swelling    History of Present Illness:  The patient is a 51 year old female who presents with a one-week history of swelling of her right lower leg. She feels like a sensation of a knot in the leg and it feels hot. She has swelling in her leg off and on for about 7 years but she seems to be worse. It's tender to touch. She notes a sharp pain when she walks. She denies any trauma to the area. She states she's felt dizzy and short of breath lately. 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 She denies any coughing, hemoptysis, or chest pain. No history of cancer, prolonged immobilization of the leg, long car or plane trips. No prior history of DVT.  Review of Systems:  Other than noted above, the patient denies any of the following symptoms: Systemic:  No fever, chills, sweats, weight gain or loss. Respiratory:  No coughing, wheezing, or shortness of breath. Cardiac:  No chest pain, tightness, pressure or syncope. GI:  No abdominal pain, swelling, distension, nausea, or vomiting. GU:  No dysuria, frequency, or hematuria. Ext:  No joint pain, muscle pain, or weakness. Skin:  No rash or itching. Neuro:  No paresthesias.  PMFSH:  Past medical history, family history, social history, meds, and allergies were reviewed.  Physical Exam:   Vital signs:  BP 128/56  Pulse 76  Temp 98.3 F (36.8 C) (Oral)  Resp 16  SpO2 100%  LMP 09/06/2012 Gen:  Alert, oriented, in no distress. Neck:  No tenderness, adenopathy, or JVD. Lungs:  Breath sounds clear and equal bilaterally.  No rales, rhonchi or wheezes. Heart:  Regular rhythm, no gallops or murmers. Abdomen:  Soft, nontender, no organomegaly or mass. Ext:  Exam of the right leg reveals what appears to be chronic edema with hyperpigmentation stasis changes. Medially there is some thickening and nodularity of the soft tissue. No  definite palpable cord. No calf tenderness. Homans sign was negative. Pulses were full. The left leg appears normal. Neuro:  Alert and oriented times 3.  No muscle weakness.  Sensation intact to light touch. Skin:  Warm and dry.  No rash or skin lesions.  Labs:   Results for orders placed during the hospital encounter of 09/10/12  CBC WITH DIFFERENTIAL      Component Value Range   WBC 9.1  4.0 - 10.5 K/uL   RBC 4.03  3.87 - 5.11 MIL/uL   Hemoglobin 6.6 (*) 12.0 - 15.0 g/dL   HCT 19.1 (*) 47.8 - 29.5 %   MCV 65.8 (*) 78.0 - 100.0 fL   MCH 16.4 (*) 26.0 - 34.0 pg   MCHC 24.9 (*) 30.0 - 36.0 g/dL   RDW 62.1 (*) 30.8 - 65.7 %   Platelets 310  150 - 400 K/uL   Neutrophils Relative 71  43 - 77 %   Lymphocytes Relative 19  12 - 46 %   Monocytes Relative 7  3 - 12 %   Eosinophils Relative 2  0 - 5 %   Basophils Relative 1  0 - 1 %   Neutro Abs 6.5  1.7 - 7.7 K/uL   Lymphs Abs 1.7  0.7 - 4.0 K/uL   Monocytes Absolute 0.6  0.1 - 1.0 K/uL   Eosinophils Absolute 0.2  0.0 - 0.7 K/uL   Basophils Absolute 0.1  0.0 - 0.1  K/uL   RBC Morphology POLYCHROMASIA PRESENT     WBC Morphology ATYPICAL LYMPHOCYTES     Smear Review LARGE PLATELETS PRESENT    D-DIMER, QUANTITATIVE      Component Value Range   D-Dimer, Quant 0.63 (*) 0.00 - 0.48 ug/mL-FEU    Assessment:  The primary encounter diagnosis was Anemia. A diagnosis of DVT (deep venous thrombosis) was also pertinent to this visit.  Plan:   1.  The following meds were prescribed:   New Prescriptions   No medications on file   2.  The patient was transferred to the emergency department via shuttle in stable condition.  Reuben Likes, MD 09/11/12 [phone]

## 2012-09-11 NOTE — Progress Notes (Signed)
ANTIBIOTIC CONSULT NOTE - INITIAL  Pharmacy Consult for vancomycin Indication: R/o cellulitis  No Known Allergies  Patient Measurements: Height: 5\' 8"  (172.7 cm) Weight: 233 lb 4 oz (105.8 kg) IBW/kg (Calculated) : 63.9   Vital Signs: Temp: 97.8 F (36.6 C) (10/18 0210) Temp src: Oral (10/18 0210) BP: 143/73 mmHg (10/18 0210) Pulse Rate: 80  (10/18 0210) Intake/Output from previous day: 10/17 0701 - 10/18 0700 In: 12.5 [Blood:12.5] Out: -  Intake/Output from this shift: Total I/O In: 12.5 [Blood:12.5] Out: -   Labs:  Katherine Harvey 09/10/12 2101 09/10/12 1615  WBC -- 9.1  HGB 8.8* 6.6*  PLT -- 310  LABCREA -- --  CREATININE 1.00 --   Estimated Creatinine Clearance: 85.7 ml/min (by C-G formula based on Cr of 1). No results found for this basename: VANCOTROUGH:2,VANCOPEAK:2,VANCORANDOM:2,GENTTROUGH:2,GENTPEAK:2,GENTRANDOM:2,TOBRATROUGH:2,TOBRAPEAK:2,TOBRARND:2,AMIKACINPEAK:2,AMIKACINTROU:2,AMIKACIN:2, in the last 72 hours   Microbiology: No results found for this or any previous visit (from the past 720 hour(s)).  Medical History: Past Medical History  Diagnosis Date  . Heart murmur   . Anemia   . Blood transfusion without reported diagnosis   . Fibroids     Medications:  Scheduled:    . docusate sodium  100 mg Oral BID  . ferrous fumarate  1 tablet Oral Daily  . sodium chloride  3 mL Intravenous Q12H   Assessment: 51 yo female admitted with r/o cellulitis of R leg. Pharmacy consulted to manage vancomycin.  Goal of Therapy:  Vancomycin trough level 10-15 mcg/ml  Plan:  1.Vancomycin 1.5gm IV X 1, then 1gm IV Q12H.  Katherine Harvey, Katherine Harvey 09/11/2012,2:19 AM

## 2012-09-11 NOTE — Progress Notes (Signed)
Pt Hgb 7.0. MD notified, orders to give 1U  Packed RBC.

## 2012-09-11 NOTE — Progress Notes (Signed)
Clinical Social Work Department BRIEF PSYCHOSOCIAL ASSESSMENT 09/11/2012  Patient:  Katherine Harvey, Katherine Harvey     Account Number:  [id number]     Admit date:  09/10/2012  Clinical Social Worker:  Dennison Bulla  Date/Time:  09/11/2012 02:15 PM  Referred by:  Physician  Date Referred:  09/11/2012 Referred for  Psychosocial assessment   Other Referral:   Interview type:  Patient Other interview type:    PSYCHOSOCIAL DATA Living Status:  FAMILY Admitted from facility:   Level of care:   Primary support name:  Zollie Beckers Primary support relationship to patient:  SPOUSE Degree of support available:   Adequate    CURRENT CONCERNS Current Concerns  Financial Resources   Other Concerns:    SOCIAL WORK ASSESSMENT / PLAN CSW received referral from MD. CSW reviewed chart and met with patient at bedside. No visitors present.    CSW introduced myself and explained role. Patient reports that she works at [employer] and lives with family. Patient reports she is worried about paying her hospital bill since she does not have insurance. Patient reports that her job does not offer insurance. Patient reports she applied for Medicaid 3 years ago and did not qualify at that time. Patient asked CSW if she would qualify now. CSW encouraged patient to direct those questions to the Department of Social Services since CSW is not affiliated with facility. Patient reports she will follow up after dc.  CSW provided patient with flyer on paying her bill. Patient reports no further needs at this time. CSW is signing off but available if needed.   Assessment/plan status:  No Further Intervention Required Other assessment/ plan:   Information/referral to community resources:   "Information About Paying your Sales executive of Social Services for [employer] questions    PATIENT'S/FAMILY'S RESPONSE TO PLAN OF CARE: Patient alert and oriented. Patient receptive of CSW consult and engaged throughout assessment.

## 2012-09-11 NOTE — Progress Notes (Signed)
[facility] PROGRESS NOTE  Katherine Harvey QMV:[id number] DOB: Feb 01, 1961 DOA: 09/10/2012 PCP: Sheila Oats, MD  Assessment/Plan: Anemia - s/p transfusion of 2u PRBC, follow and recheck -Hemoccult negative likely etiology is vaginal bleeding from fibroids. -She would need to follow up with her OB/GYN after discharge.  .Hypertension - stable  .Cellulitis - improving, continue vancomycin . Dopplers of lower extremity with superficial DVT  .superficial DVT Follow, nsaids as needed Code Status: full Family Communication: directly with pt at bedside Disposition Plan: to home once medically ready   Consultants:  none  Procedures:  Doppler, superficial DVT  Antibiotics:  vanc started on 10/17  HPI/Subjective: Pt states decreased R. Swelling, denies SOB  Objective: Filed Vitals:   09/11/12 0715 09/11/12 0815 09/11/12 0915 09/11/12 1005  BP: 135/74 119/68 127/76 136/72  Pulse: 86 83 88 94  Temp: 98.4 F (36.9 C) 98.6 F (37 C) 98.8 F (37.1 C) 98.2 F (36.8 C)  TempSrc:  Oral Oral Oral  Resp: 18 18 18 18   Height:      Weight:      SpO2:   96% 98%    Intake/Output Summary (Last 24 hours) at 09/11/12 1325 Last data filed at 09/11/12 1018  Gross per 24 hour  Intake  415.5 ml  Output      0 ml  Net  415.5 ml   Filed Weights   09/11/12 0210  Weight: 105.8 kg (233 lb 4 oz)    Exam:   General:  Middle aged female in NAD  Cardiovascular: RRR nl S1S2  Respiratory: CTAB  Abdomen: soft +BS, NT/ND  Extremities:RLE with localized lower area of swelling, and warmth, decreased erythema, mild tenderness  Data Reviewed: Basic Metabolic Panel:  Lab 09/11/12 2841 09/10/12 2101  NA 141 142  K 3.7 3.6  CL 107 107  CO2 24 --  GLUCOSE 102* 122*  BUN 12 12  CREATININE 0.69 1.00  CALCIUM 9.1 --  MG 2.4 --  PHOS 3.5 --   Liver Function Tests:  Lab 09/11/12 0456  AST 15  ALT 9  ALKPHOS 58  BILITOT 0.3  PROT 6.1  ALBUMIN 3.0*   No results found for  this basename: LIPASE:5,AMYLASE:5 in the last 168 hours No results found for this basename: AMMONIA:5 in the last 168 hours CBC:  Lab 09/11/12 0456 09/10/12 2101 09/10/12 1615  WBC 7.5 -- 9.1  NEUTROABS -- -- 6.5  HGB 7.0* 8.8* 6.6*  HCT 25.7* 26.0* 26.5*  MCV 67.8* -- 65.8*  PLT 258 -- 310   Cardiac Enzymes: No results found for this basename: CKTOTAL:5,CKMB:5,CKMBINDEX:5,TROPONINI:5 in the last 168 hours BNP (last 3 results) No results found for this basename: PROBNP:3 in the last 8760 hours CBG: No results found for this basename: GLUCAP:5 in the last 168 hours  No results found for this or any previous visit (from the past 240 hour(s)).   Studies: No results found.  Scheduled Meds:   . docusate sodium  100 mg Oral BID  . ferrous fumarate  1 tablet Oral Daily  . sodium chloride  3 mL Intravenous Q12H  . vancomycin  1,500 mg Intravenous Once  . vancomycin  1,000 mg Intravenous Q12H   Continuous Infusions:   . sodium chloride 75 mL/hr at 09/11/12 0427    Principal Problem:  *Anemia Active Problems:  Hypertension  Cellulitis    Time spent:    Kela Millin  [facility] Pager [phone]. If 8PM-8AM, please contact night-coverage at [email or web address], password [email or web address] 09/11/2012, 1:25  PM  LOS: 1 day

## 2012-09-11 NOTE — Progress Notes (Signed)
Pts Hgb at 2101 was 8.8. 1 unit of RBCs given in the ED. MD paged and asked if 2nd unit should be given. MD would like to hold 2nd unit and await the next CBC results.

## 2012-09-11 NOTE — Progress Notes (Signed)
VASCULAR LAB PRELIMINARY  PRELIMINARY  PRELIMINARY  PRELIMINARY  Bilateral lower extremity venous duplex completed.    Preliminary report: Negative for DVT bilaterally.  Positive for superficial vein thrombosis in the right leg at the mid anterior aspect of the calf.  Sheika Coutts, 09/11/2012, 12:01 PM

## 2012-09-12 LAB — TYPE AND SCREEN
ABO/RH(D): B POS
Antibody Screen: NEGATIVE
Unit division: 0
Unit division: 0

## 2012-09-12 LAB — CBC
MCH: 18.9 pg — ABNORMAL LOW (ref 26.0–34.0)
MCHC: 27.7 g/dL — ABNORMAL LOW (ref 30.0–36.0)
Platelets: 274 10*3/uL (ref 150–400)
RBC: 4.28 MIL/uL (ref 3.87–5.11)
RDW: 27.2 % — ABNORMAL HIGH (ref 11.5–15.5)

## 2012-09-12 MED ORDER — SODIUM CHLORIDE 0.9 % IV SOLN
INTRAVENOUS | Status: DC
  Administered 2012-09-12: 08:00:00 via INTRAVENOUS
  Administered 2012-09-12: 20 mL/h via INTRAVENOUS

## 2012-09-12 MED ORDER — DOXYCYCLINE HYCLATE 100 MG PO TABS: 100.0000 mg | ORAL_TABLET | Freq: Two times a day (BID) | ORAL | Status: DC

## 2012-09-12 MED ORDER — HYDROCODONE-ACETAMINOPHEN 5-325 MG PO TABS: 1.0000 | ORAL_TABLET | ORAL | Status: DC | PRN

## 2012-09-12 NOTE — Progress Notes (Signed)
09/12/12 Patient is being discharged home today. IV site removed and discharge instructions reviewed.

## 2012-09-12 NOTE — Discharge Summary (Signed)
Physician Discharge Summary  Katherine Harvey:[id number] DOB: Mar 04, 1961 DOA: 09/10/2012  PCP: Sheila Oats, MD  Admit date: 09/10/2012 Discharge date: 09/12/2012  Recommendations for Outpatient Follow-up:  1.      Follow-up Information    Please follow up. (PCP in 1-2weeks, call for appt upon discharge)         Discharge Diagnoses:  Principal Problem:  *Anemia Active Problems:  Hypertension  Cellulitis   Discharge Condition: Improved/stable  Diet recommendation: regular  Filed Weights   09/11/12 0210  Weight: 105.8 kg (233 lb 4 oz)    History of present illness:  has a past medical history of Heart murmur; Anemia; Blood transfusion without reported diagnosis; and Fibroids.  Presented with  1 week of right leg swelling redness and warmth and now pain as well. No fever no chills. She have had similar problems in this leg in the past and was treated successfully with antibiotics. For the past few weeks she have had shortness of breath and feeling light headed. Today she have had some heartburn like chest pains.  She presented to Urgent care and was found to have anemia with hg down to 6.5 . She hs hx of fibroids and heavy menses with recurrent anemia requiring blood transfusions in the past. She was supposed to have hysterectomy but it was delayed. And now her fibroids have been improving.    Hospital Course:   .Anemia - upon admission she was transfused 2 units of packed red blood cells. Hemoccult was done and came back negative negative likely etiology is vaginal bleeding from fibroids. She is to follow up with her OB/GYN after discharge at the [facility] clinic. Her hemoglobin improved following transfusion and remained stable-8.1 today prior to discharge. She is to continue iron supplementation as previously.  . history of Hypertension - her blood pressures remained controlled in the hospital on no medications. She is to follow up outpatient with PCP.  Marland KitchenCellulitis -  upon admission patient was started on empiric antibiotics with vancomycin. Dopplers of lower extremity showed superficial vein thrombosis, no DVT. She improved clinically on antibiotics. She has remained afebrile and hemodynamically stable with no leukocytosis. She'll be discharged on doxycycline and is to followup outpatient with PCP-case manager to assist with setting up patient with PCP  .superficial vein thrombosis  warm compresses, nsaids as needed. To follow up outpatient with PCP.  Procedures:. Doppler ultrasound Negative for DVT bilaterally. Positive for superficial vein thrombosis in the right leg at the mid anterior aspect of the calf.   Consultations:  None  Discharge Exam: Filed Vitals:   09/11/12 1400 09/11/12 2201 09/12/12 0616 09/12/12 1400  BP: 126/70 124/74 128/71 131/74  Pulse: 89 86 72 82  Temp: 98.3 F (36.8 C) 98.7 F (37.1 C) 98 F (36.7 C) 98.1 F (36.7 C)  TempSrc: Oral Oral Oral Oral  Resp: 18 20 18 18   Height:      Weight:      SpO2: 99% 97% 99% 96%    Exam:  General: Middle aged female in NAD  Cardiovascular: RRR nl S1S2  Respiratory: CTAB  Abdomen: soft +BS, NT/ND  Extremities: Decreased RLE  localized lower area of swelling, and warmth, decreased tenderness, no erythema.   Discharge Instructions  Discharge Orders    Future Orders Please Complete By Expires   Diet general      Increase activity slowly          Medication List     As of 09/12/2012  3:11 PM  TAKE these medications         aspirin EC 81 MG tablet   Take 81 mg by mouth daily.      calcium-vitamin D 250-100 MG-UNIT per tablet   Take 1 tablet by mouth 2 (two) times daily.      diphenhydrAMINE 25 MG tablet   Commonly known as: BENADRYL   Take 25 mg by mouth every 6 (six) hours as needed. For allergies      doxycycline 100 MG tablet   Commonly known as: VIBRA-TABS   Take 1 tablet (100 mg total) by mouth 2 (two) times daily.      ferrous fumarate 325 (106 FE) MG  Tabs   Commonly known as: HEMOCYTE - 106 mg FE   Take 1 tablet by mouth.      HYDROcodone-acetaminophen 5-325 MG per tablet   Commonly known as: NORCO/VICODIN   Take 1-2 tablets by mouth every 4 (four) hours as needed.      multivitamin capsule   Take 1 capsule by mouth daily.           Follow-up Information    Please follow up. (PCP in 1-2weeks, call for appt upon discharge)           The results of significant diagnostics from this hospitalization (including imaging, microbiology, ancillary and laboratory) are listed below for reference.    Significant Diagnostic Studies: No results found.  Microbiology: No results found for this or any previous visit (from the past 240 hour(s)).   Labs: Basic Metabolic Panel:  Lab 09/11/12 5621 09/10/12 2101  NA 141 142  K 3.7 3.6  CL 107 107  CO2 24 --  GLUCOSE 102* 122*  BUN 12 12  CREATININE 0.69 1.00  CALCIUM 9.1 --  MG 2.4 --  PHOS 3.5 --   Liver Function Tests:  Lab 09/11/12 0456  AST 15  ALT 9  ALKPHOS 58  BILITOT 0.3  PROT 6.1  ALBUMIN 3.0*   No results found for this basename: LIPASE:5,AMYLASE:5 in the last 168 hours No results found for this basename: AMMONIA:5 in the last 168 hours CBC:  Lab 09/12/12 0524 09/11/12 2145 09/11/12 1519 09/11/12 0456 09/10/12 2101 09/10/12 1615  WBC 7.4 7.9 7.2 7.5 -- 9.1  NEUTROABS -- -- -- -- -- 6.5  HGB 8.1* 8.0* 8.3* 7.0* 8.8* --  HCT 29.2* 28.9* 29.5* 25.7* 26.0* --  MCV 68.2* 68.2* 68.1* 67.8* -- 65.8*  PLT 274 262 264 258 -- 310   Cardiac Enzymes: No results found for this basename: CKTOTAL:5,CKMB:5,CKMBINDEX:5,TROPONINI:5 in the last 168 hours BNP: BNP (last 3 results) No results found for this basename: PROBNP:3 in the last 8760 hours CBG: No results found for this basename: GLUCAP:5 in the last 168 hours  Time coordinating discharge: <39minutes  Signed:  Addie Alonge C  [facility] 09/12/2012, 3:11 PM

## 2012-09-13 NOTE — Care Management Note (Signed)
    Page 1 of 1   09/13/2012     6:30:55 PM   CARE MANAGEMENT NOTE 09/13/2012  Patient:  Katherine Harvey, Katherine Harvey   Account Number:  [id number]  Date Initiated:  09/13/2012  Documentation initiated by:  [facility]  Subjective/Objective Assessment:     Action/Plan:   lives at home with husband   Anticipated DC Date:  09/13/2012   Anticipated DC Plan:  HOME/SELF CARE      DC Planning Services  CM consult  [facility]      Choice offered to / List presented to:             Status of service:  Completed, signed off Medicare Important Message given?   (If response is "NO", the following Medicare IM given date fields will be blank) Date Medicare IM given:   Date Additional Medicare IM given:    Discharge Disposition:  HOME/SELF CARE  Per UR Regulation:    If discussed at Long Length of Stay Meetings, dates discussed:    Comments:  09/13/2012 1800 NCM spoke to pt and states she is looking for a PCP. States she does work and husband works. She did follow up with [facility] and they are requesting their financial information to determine copay cost. She still is requesting other resources. Will send pt name of [facility] for self pay pts. Will send via mail. Verified address. Isidoro Donning RN CCM Case Mgmt phone [phone]

## 2012-09-28 ENCOUNTER — Telehealth: Payer: Self-pay | Admitting: *Deleted

## 2012-09-28 NOTE — Telephone Encounter (Addendum)
Pt left message stating that she is bleeding and soaking a tampon in 1 hr and "dropping lugees". She was in the hospital 3 wks ago and hemoglobin was 6, may be 8 now. Please call back.   11/5  1105  I called pt this morning and she states that she began a period yesterday and had very heavy bleeding all Sheriann Newmann. Today her bleeding is not as heavy.  Pt had blood transfusion on 10/21 and was supposed to call for f/u appt 1 wk later but did not.  I advised pt to use pads only today and if her bleeding is >1 saturated pad per hour for 3 consecutive hours, or if she becomes severely weak and dizzy, she should go to [facility] for evaluation.  I gave pt appt in clinic tomorrow @ 1415.

## 2012-09-30 ENCOUNTER — Other Ambulatory Visit (HOSPITAL_COMMUNITY)
Admission: RE | Admit: 2012-09-30 | Discharge: 2012-09-30 | Disposition: A | Discharge status: Home / Self Care | Payer: Self-pay | Source: Ambulatory Visit | Attending: Obstetrics & Gynecology | Admitting: Obstetrics & Gynecology

## 2012-09-30 ENCOUNTER — Ambulatory Visit (INDEPENDENT_AMBULATORY_CARE_PROVIDER_SITE_OTHER): Payer: Self-pay | Admitting: Obstetrics & Gynecology

## 2012-09-30 ENCOUNTER — Encounter: Payer: Self-pay | Admitting: Obstetrics & Gynecology

## 2012-09-30 VITALS — BP 122/66 | HR 86 | Temp 97.0°F | Ht 68.0 in | Wt 232.5 lb

## 2012-09-30 DIAGNOSIS — D259 Leiomyoma of uterus, unspecified: Secondary | ICD-10-CM

## 2012-09-30 DIAGNOSIS — N920 Excessive and frequent menstruation with regular cycle: Secondary | ICD-10-CM

## 2012-09-30 DIAGNOSIS — D219 Benign neoplasm of connective and other soft tissue, unspecified: Secondary | ICD-10-CM

## 2012-09-30 DIAGNOSIS — D649 Anemia, unspecified: Secondary | ICD-10-CM

## 2012-09-30 LAB — CBC WITH DIFFERENTIAL/PLATELET
Basophils Absolute: 0.1 10*3/uL (ref 0.0–0.1)
Basophils Relative: 1 % (ref 0–1)
HCT: 24.7 % — ABNORMAL LOW (ref 36.0–46.0)
Lymphocytes Relative: 29 % (ref 12–46)
Neutro Abs: 3.7 10*3/uL (ref 1.7–7.7)
Neutrophils Relative %: 59 % (ref 43–77)
Platelets: 208 10*3/uL (ref 150–400)
RDW: 25.9 % — ABNORMAL HIGH (ref 11.5–15.5)
WBC: 6.3 10*3/uL (ref 4.0–10.5)

## 2012-09-30 LAB — PROTIME-INR
INR: 0.96 (ref <1.50)
Prothrombin Time: 12.8 seconds (ref 11.6–15.2)

## 2012-09-30 LAB — APTT: aPTT: 30 seconds (ref 24–37)

## 2012-09-30 MED ORDER — MEGESTROL ACETATE 40 MG PO TABS: 40.0000 mg | ORAL_TABLET | Freq: Every day | ORAL | Status: DC

## 2012-09-30 NOTE — Progress Notes (Signed)
Subjective:     Patient ID: Katherine Harvey, female   DOB: 28-Jul-1961, 51 y.o.   MRN: [id number]  HPI  Pt with h/o severe heavy vaginal bleeding.  Pt s/p transfusion of 2 units of PRBC's last month.  Pt says that she has a h/o vaginal bleeding which has become more sever in the last 3 weeks.  Pt here for endo bx.  Needs to leave to pick up daughter from school.   Review of Systems     Objective:   Physical Exam BP 122/66  Pulse 86  Temp 97 F (36.1 C)  Ht 5\' 8"  (1.727 m)  Wt 232 lb 8 oz (105.461 kg)  BMI 35.35 kg/m2  LMP 09/28/2012  The indications for endometrial biopsy were reviewed.   Risks of the biopsy including cramping, bleeding, infection, uterine perforation, inadequate specimen and need for additional procedures  were discussed. The patient states she understands and agrees to undergo procedure today. Consent was signed. Time out was performed. Urine HCG was negative. A sterile speculum was placed in the patient's vagina and the cervix was prepped with Betadine. A single-toothed tenaculum was placed on the anterior lip of the cervix to stabilize it. The 3 mm pipelle was introduced into the endometrial cavity without difficulty to a depth of 12cm, and a moderate amount of tissue was obtained and sent to pathology. The instruments were removed from the patient's vagina. Minimal bleeding from the cervix was noted. The patient tolerated the procedure well. Routine post-procedure instructions were given to the patient. The patient will follow up to review the results and for further management.    05/11/12 sono Comparison: 04/23/2012  Findings:  Uterus: The uterus is 16.6 x 8.6 x 12.4 cm. Multiple fibroids are  identified. The largest measure 10.8 x 8.3 x 8.6 cm in the lower  uterus and 4.2 x 3.0 x 3.0 cm in the fundus.  Endometrium: The endometrium is not visualized because of large  fibroids.  Right ovary: The right ovary is not seen because of fibroids.  Left ovary: The left  ovary is not seen because of fibroids.  Other findings: No free fluid  IMPRESSION:  1. Enlarged uterus containing multiple fibroids.  2. The endometrium is obscured by fibroids.  3. Although there is no evidence for adnexal mass, the ovaries are  obscured by fibroids.      Assessment:     Menorrhagia- thought due to sx uterine fiborids    Plan:     F/u Endobx F/u 2 weeks toreview results and discuss long term control of bleeding Megace 40mg  q day

## 2012-09-30 NOTE — Patient Instructions (Signed)
Levonorgestrel intrauterine device (IUD) What is this medicine? LEVONORGESTREL IUD (LEE voe nor jes trel) is a contraceptive (birth control) device. It is used to prevent pregnancy and to treat heavy bleeding that occurs during your period. It can be used for up to 5 years. This medicine may be used for other purposes; ask your health care provider or pharmacist if you have questions. What should I tell my health care provider before I take this medicine? They need to know if you have any of these conditions: -abnormal Pap smear -cancer of the breast, uterus, or cervix -diabetes -endometritis -genital or pelvic infection now or in the past -have more than one sexual partner or your partner has more than one partner -heart disease -history of an ectopic or tubal pregnancy -immune system problems -IUD in place -liver disease or tumor -problems with blood clots or take blood-thinners -use intravenous drugs -uterus of unusual shape -vaginal bleeding that has not been explained -an unusual or allergic reaction to levonorgestrel, other hormones, silicone, or polyethylene, medicines, foods, dyes, or preservatives -pregnant or trying to get pregnant -breast-feeding How should I use this medicine? This device is placed inside the uterus by a health care professional. Talk to your pediatrician regarding the use of this medicine in children. Special care may be needed. Overdosage: If you think you have taken too much of this medicine contact a poison control center or emergency room at once. NOTE: This medicine is only for you. Do not share this medicine with others. What if I miss a dose? This does not apply. What may interact with this medicine? Do not take this medicine with any of the following medications: -amprenavir -bosentan -fosamprenavir This medicine may also interact with the following medications: -aprepitant -barbiturate medicines for inducing sleep or treating  seizures -bexarotene -griseofulvin -medicines to treat seizures like carbamazepine, ethotoin, felbamate, oxcarbazepine, phenytoin, topiramate -modafinil -pioglitazone -rifabutin -rifampin -rifapentine -some medicines to treat HIV infection like atazanavir, indinavir, lopinavir, nelfinavir, tipranavir, ritonavir -St. John's wort -warfarin This list may not describe all possible interactions. Give your health care provider a list of all the medicines, herbs, non-prescription drugs, or dietary supplements you use. Also tell them if you smoke, drink alcohol, or use illegal drugs. Some items may interact with your medicine. What should I watch for while using this medicine? Visit your doctor or health care professional for regular check ups. See your doctor if you or your partner has sexual contact with others, becomes HIV positive, or gets a sexual transmitted disease. This product does not protect you against HIV infection (AIDS) or other sexually transmitted diseases. You can check the placement of the IUD yourself by reaching up to the top of your vagina with clean fingers to feel the threads. Do not pull on the threads. It is a good habit to check placement after each menstrual period. Call your doctor right away if you feel more of the IUD than just the threads or if you cannot feel the threads at all. The IUD may come out by itself. You may become pregnant if the device comes out. If you notice that the IUD has come out use a backup birth control method like condoms and call your health care provider. Using tampons will not change the position of the IUD and are okay to use during your period. What side effects may I notice from receiving this medicine? Side effects that you should report to your doctor or health care professional as soon as possible: -allergic reactions   like skin rash, itching or hives, swelling of the face, lips, or tongue -fever, flu-like symptoms -genital sores -high  blood pressure -no menstrual period for 6 weeks during use -pain, swelling, warmth in the leg -pelvic pain or tenderness -severe or sudden headache -signs of pregnancy -stomach cramping -sudden shortness of breath -trouble with balance, talking, or walking -unusual vaginal bleeding, discharge -yellowing of the eyes or skin Side effects that usually do not require medical attention (report to your doctor or health care professional if they continue or are bothersome): -acne -breast pain -change in sex drive or performance -changes in weight -cramping, dizziness, or faintness while the device is being inserted -headache -irregular menstrual bleeding within first 3 to 6 months of use -nausea This list may not describe all possible side effects. Call your doctor for medical advice about side effects. You may report side effects to FDA at 1-800-FDA-1088. Where should I keep my medicine? This does not apply. NOTE: This sheet is a summary. It may not cover all possible information. If you have questions about this medicine, talk to your doctor, pharmacist, or health care provider.  2012, [employer] Standard. (12/02/2008 6:39:08 PM)Menorrhagia Dysfunctional uterine bleeding is different from a normal menstrual period. When periods are heavy or there is more bleeding than is usual for you, it is called menorrhagia. It may be caused by hormonal imbalance, or physical, metabolic, or other problems. Examination is necessary in order that your caregiver may treat treatable causes. If this is a continuing problem, a D&C may be needed. That means that the cervix (the opening of the uterus or womb) is dilated (stretched larger) and the lining of the uterus is scraped out. The tissue scraped out is then examined under a microscope by a specialist (pathologist) to make sure there is nothing of concern that needs further or more extensive treatment. HOME CARE INSTRUCTIONS   If medications were prescribed,  take exactly as directed. Do not change or switch medications without consulting your caregiver.  Long term heavy bleeding may result in iron deficiency. Your caregiver may have prescribed iron pills. They help replace the iron your body lost from heavy bleeding. Take exactly as directed. Iron may cause constipation. If this becomes a problem, increase the bran, fruits, and roughage in your diet.  Do not take aspirin or medicines that contain aspirin one week before or during your menstrual period. Aspirin may make the bleeding worse.  If you need to change your sanitary pad or tampon more than once every 2 hours, stay in bed and rest as much as possible until the bleeding stops.  Eat well-balanced meals. Eat foods high in iron. Examples are leafy green vegetables, meat, liver, eggs, and whole grain breads and cereals. Do not try to lose weight until the abnormal bleeding has stopped and your blood iron level is back to normal. SEEK MEDICAL CARE IF:   You need to change your sanitary pad or tampon more than once an hour.  You develop nausea (feeling sick to your stomach) and vomiting, dizziness, or diarrhea while you are taking your medicine.  You have any problems that may be related to the medicine you are taking. SEEK IMMEDIATE MEDICAL CARE IF:   You have a fever.  You develop chills.  You develop severe bleeding or start to pass blood clots.  You feel dizzy or faint. MAKE SURE YOU:   Understand these instructions.  Will watch your condition.  Will get help right away if you are not  doing well or get worse. Document Released: 11/11/2005 Document Revised: 02/03/2012 Document Reviewed: 07/01/2008 [facility] Patient Information 2013 [place].

## 2012-10-05 ENCOUNTER — Telehealth: Payer: Self-pay | Admitting: *Deleted

## 2012-10-05 NOTE — Telephone Encounter (Signed)
Message copied by Mannie Stabile on Mon Oct 05, 2012 11:36 AM ------      Message from: Willodean Rosenthal      Created: Thu Oct 01, 2012 11:15 PM       Please call pt and ask her to increase her iron to tid in ADDITION to the Megace once a day.            Her blood count came back Hct %24            Thx            clh-S

## 2012-10-05 NOTE — Telephone Encounter (Signed)
Message copied by Mannie Stabile on Mon Oct 05, 2012 11:36 AM ------      Message from: Willodean Rosenthal      Created: Sun Oct 04, 2012 12:02 PM       Please call pt.  Needs repeat endobx when she is not bleeding as heavily.  Not enough tissue to evaluate....mainly blood in specimen.            Make appt  For pt.            Thx,      clh-S

## 2012-10-06 NOTE — Telephone Encounter (Signed)
Called pt and informed her of CBC and Endo Bx results. I advised her that she needs to increase the iron to 3x per Jasline Buskirk and that she needs repeat endo Bx when she is not bleeding heavily. Pt stated that her bleeding has slowed down greatly. I told her that we will most likely repeat the Bx when she returns on 11/21.  Pt voiced understanding of all information and instructions.

## 2012-10-15 ENCOUNTER — Ambulatory Visit (INDEPENDENT_AMBULATORY_CARE_PROVIDER_SITE_OTHER): Payer: Self-pay | Admitting: Obstetrics & Gynecology

## 2012-10-15 ENCOUNTER — Encounter: Payer: Self-pay | Admitting: Obstetrics & Gynecology

## 2012-10-15 VITALS — BP 120/61 | HR 73 | Temp 97.7°F | Resp 12 | Ht 68.0 in | Wt 231.8 lb

## 2012-10-15 DIAGNOSIS — D259 Leiomyoma of uterus, unspecified: Secondary | ICD-10-CM

## 2012-10-15 DIAGNOSIS — N920 Excessive and frequent menstruation with regular cycle: Secondary | ICD-10-CM

## 2012-10-15 DIAGNOSIS — D219 Benign neoplasm of connective and other soft tissue, unspecified: Secondary | ICD-10-CM

## 2012-10-15 LAB — CBC
MCH: 20.1 pg — ABNORMAL LOW (ref 26.0–34.0)
MCHC: 28.7 g/dL — ABNORMAL LOW (ref 30.0–36.0)
RDW: 22.6 % — ABNORMAL HIGH (ref 11.5–15.5)

## 2012-10-15 MED ORDER — INTEGRA F 125-1 MG PO CAPS: 1.0000 | ORAL_CAPSULE | Freq: Every day | ORAL | Status: DC

## 2012-10-15 NOTE — Progress Notes (Signed)
Subjective:     Patient ID: Katherine Harvey, female   DOB: 04-10-1961, 51 y.o.   MRN: [id number]  HPI Pt reports that she has not had any bleeding since she was last here.  She did not get the medication prescribed due to financial concerns.   She does not want to proceed with surgery.  She wants to continue with conservative treatment.      Review of Systems     Objective:   Physical ExamBP 120/61  Pulse 73  Temp 97.7 F (36.5 C) (Oral)  Resp 12  Ht 5\' 8"  (1.727 m)  Wt 231 lb 12.8 oz (105.144 kg)  BMI 35.25 kg/m2  LMP 09/28/2012 Exam deferred  09/30/12 Hct 24%   09/30/12 endo bx Diagnosis Endometrium, biopsy - BLOOD AND SCANT ENDOMETRIUM. - SEE MICROSCOPIC DESCRIPTION. Assessment:     Menorrhagia thought due to symptomatic uterine fibroids anemia    Plan:     Megace 40mg  q day Integra 1 po q day F/u 3 months or sooner prn   Katherine Harvey, M.D., [facility]

## 2012-10-15 NOTE — Patient Instructions (Addendum)

## 2013-01-09 ENCOUNTER — Other Ambulatory Visit: Payer: Self-pay

## 2013-01-25 IMAGING — MG MM DIGITAL SCREENING BILAT
4 series · 4 of 4 positions shown · non-contrast
Comparison: Previous exams

CLINICAL DATA: Screening.

DIGITAL SCREENING MAMMOGRAM WITH CAD

[R CC]
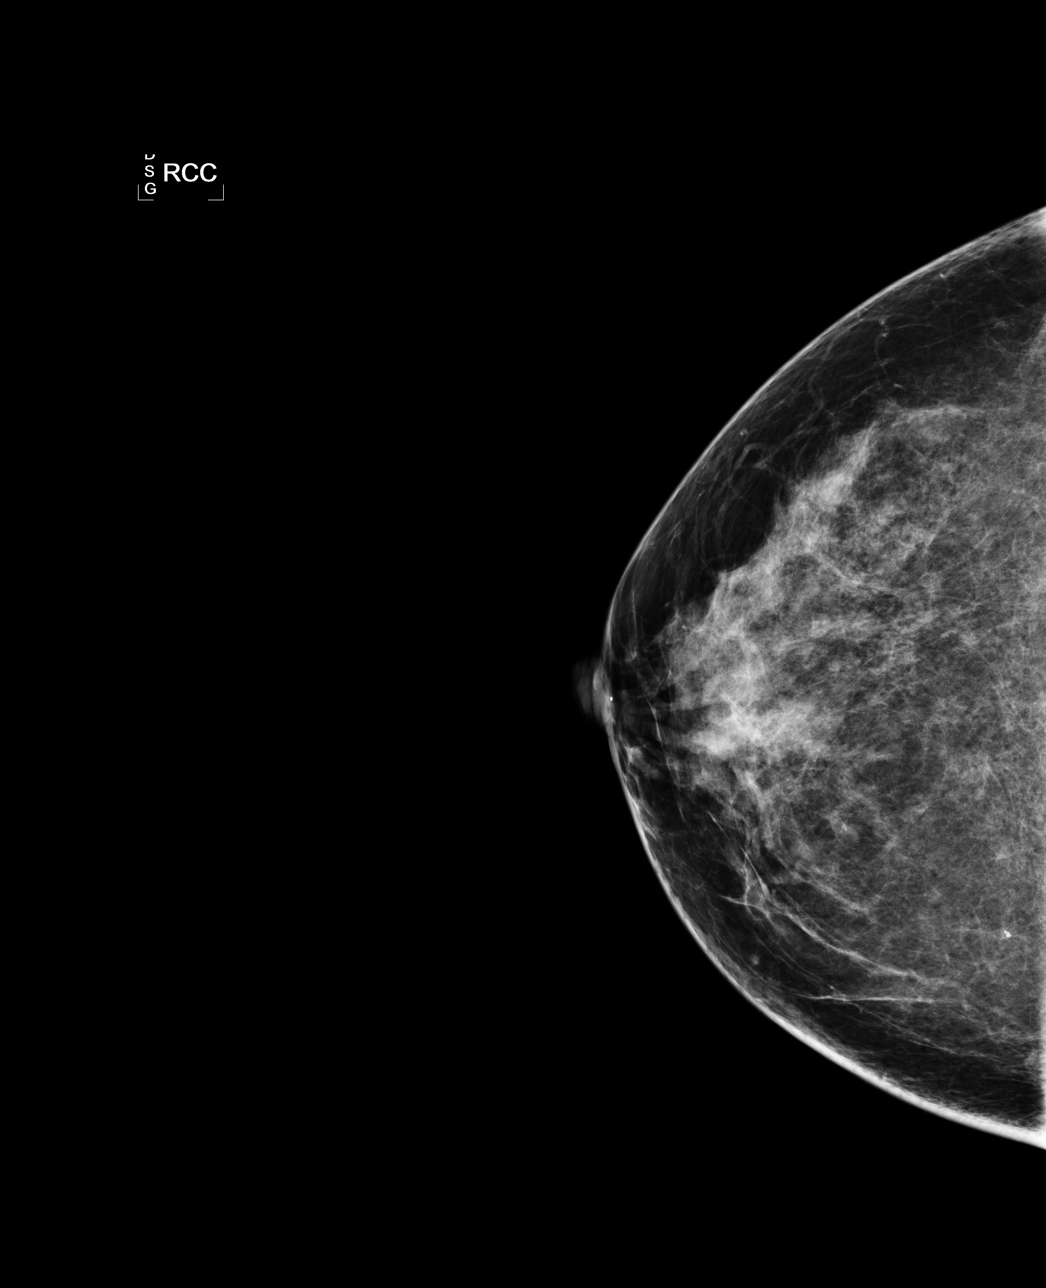

[R MLO]
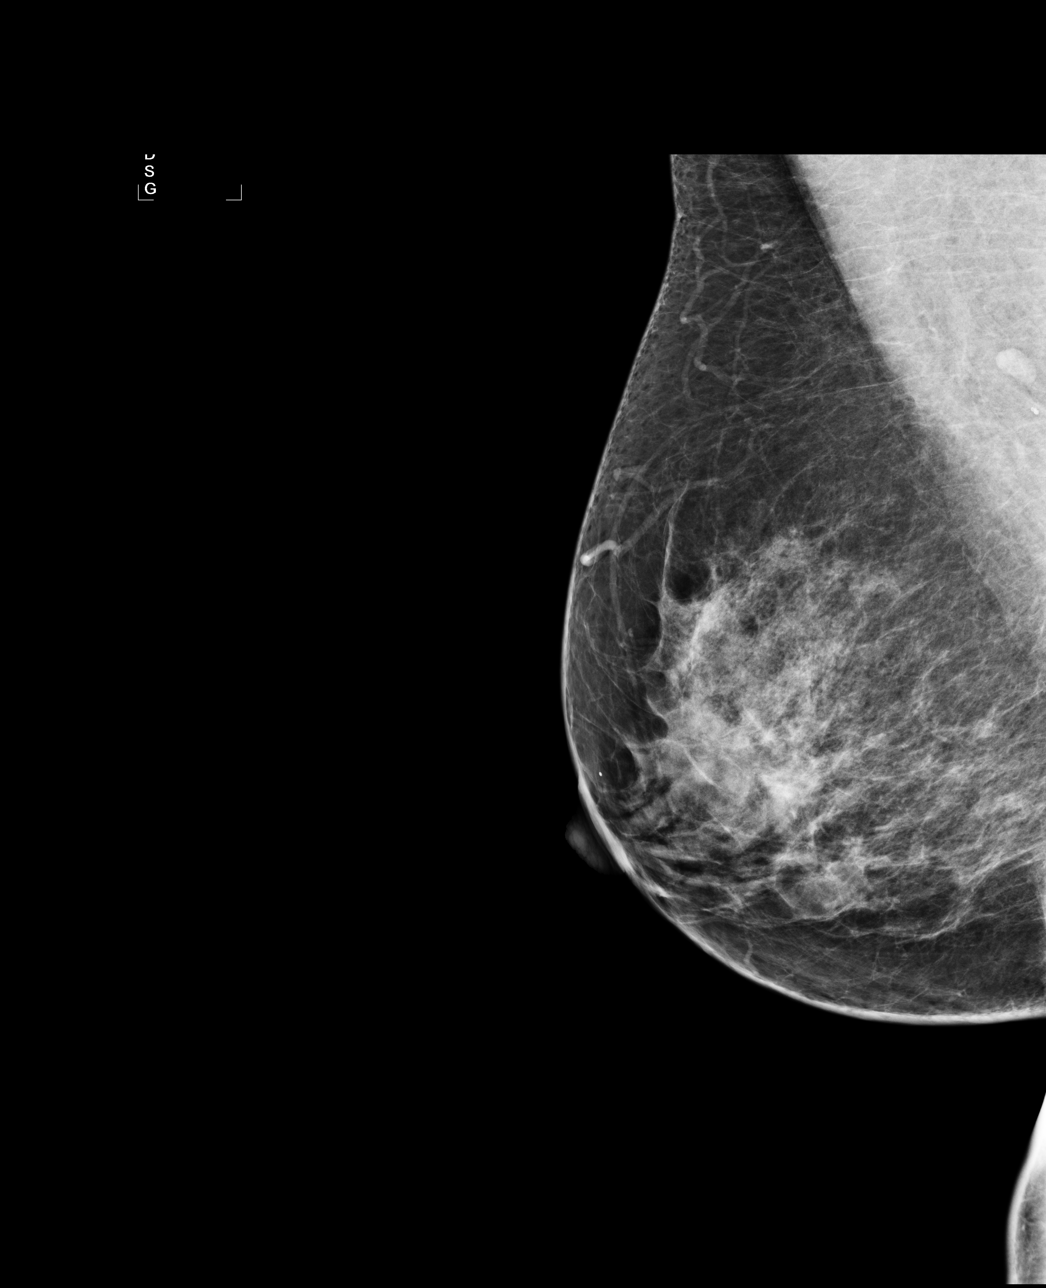

[L CC]
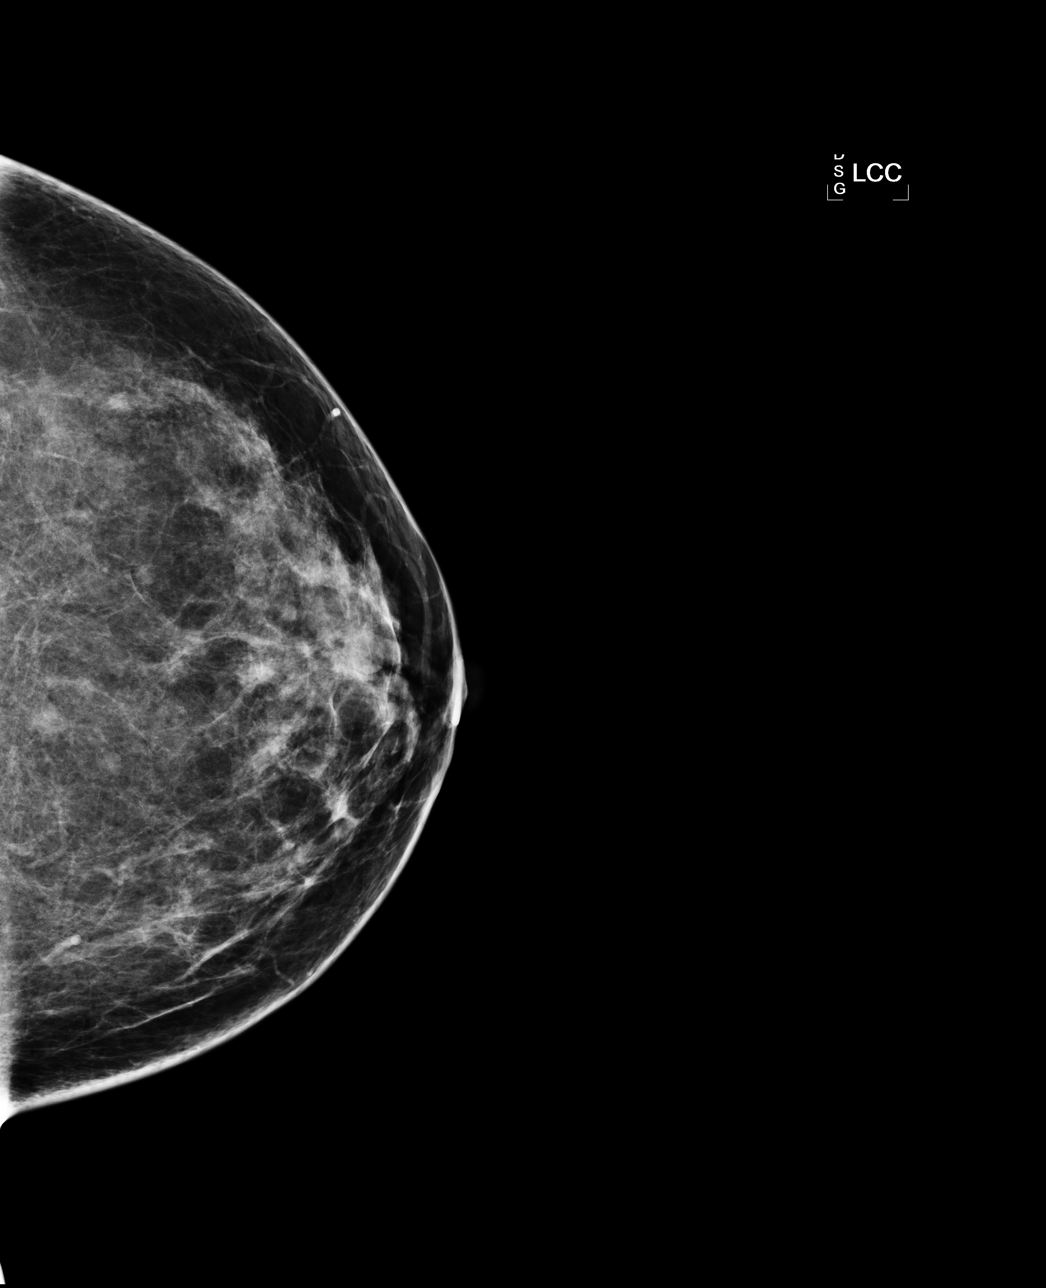

[L MLO]
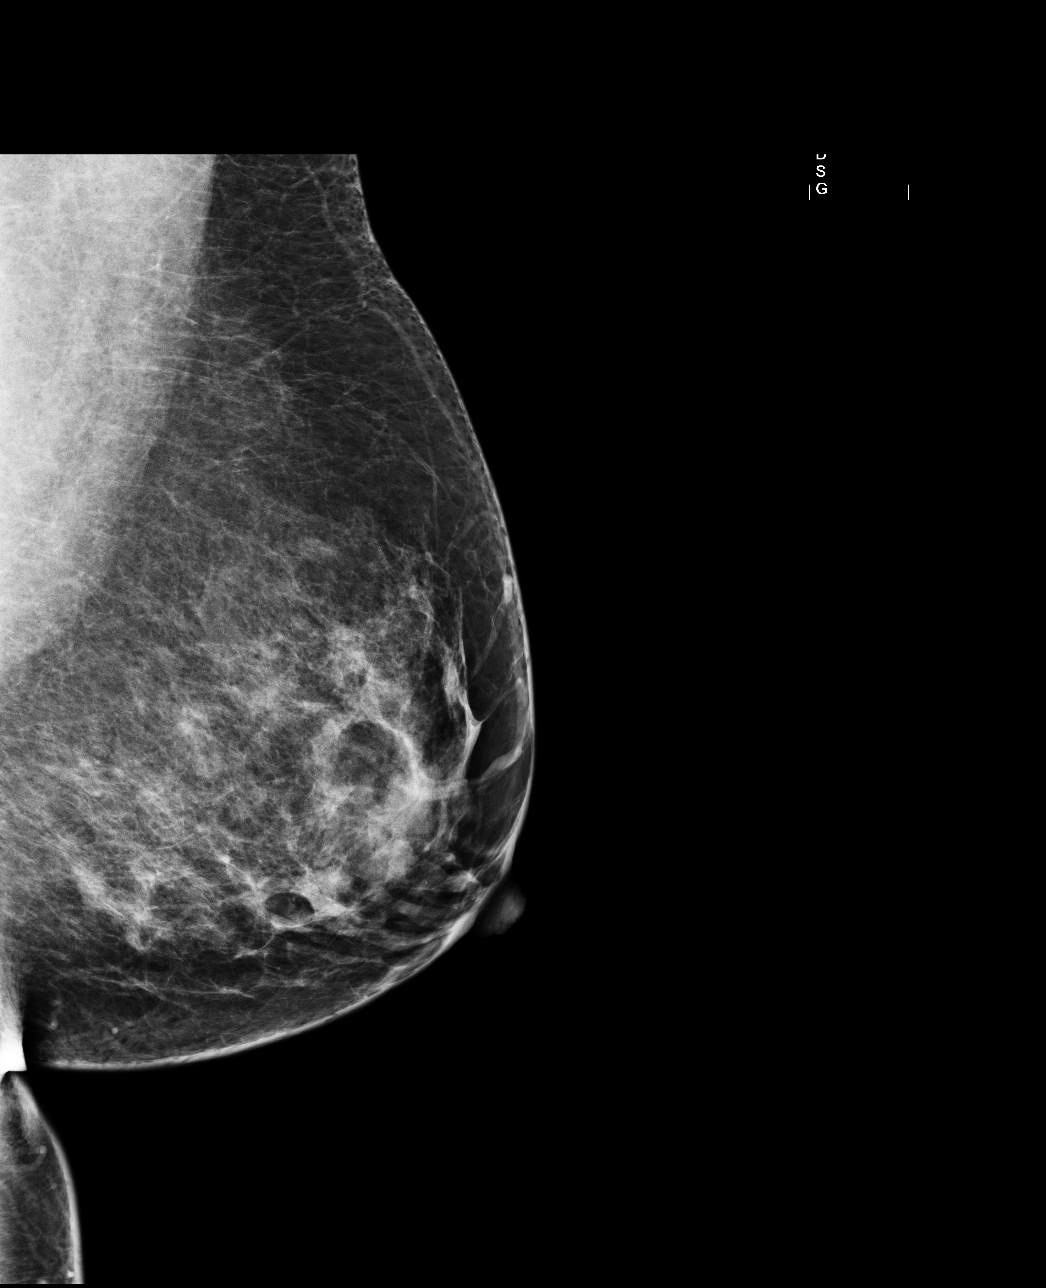

[4 of 4 positions shown; findings below may reference images not displayed]

FINDINGS: Two views of each breast demonstrate scattered
fibroglandular densities.  In the left breast, a possible mass
warrants further evaluation with spot compression views and
possibly ultrasound.  In the right breast, no masses or malignant
type calcifications are identified.

Images were processed with CAD.
IMPRESSION: Further evaluation is suggested for possible mass in the left
breast.

RECOMMENDATION:
Diagnostic mammogram and possibly ultrasound of the left breast.
(Code:BO-Z-OOP)

BI-RADS CATEGORY 0:  Incomplete.  Need additional imaging
evaluation and/or prior mammograms for comparison.

## 2013-09-30 ENCOUNTER — Other Ambulatory Visit: Payer: Self-pay

## 2013-10-13 ENCOUNTER — Encounter (HOSPITAL_COMMUNITY): Payer: Self-pay

## 2013-10-13 ENCOUNTER — Inpatient Hospital Stay (HOSPITAL_COMMUNITY)
Admission: AD | Admit: 2013-10-13 | Discharge: 2013-10-14 | DRG: 812 | Disposition: A | Discharge status: Home / Self Care | Payer: Self-pay | Source: Ambulatory Visit | Attending: Obstetrics and Gynecology | Admitting: Obstetrics and Gynecology

## 2013-10-13 DIAGNOSIS — D509 Iron deficiency anemia, unspecified: Principal | ICD-10-CM | POA: Diagnosis present

## 2013-10-13 DIAGNOSIS — D259 Leiomyoma of uterus, unspecified: Secondary | ICD-10-CM

## 2013-10-13 DIAGNOSIS — N949 Unspecified condition associated with female genital organs and menstrual cycle: Secondary | ICD-10-CM

## 2013-10-13 DIAGNOSIS — N938 Other specified abnormal uterine and vaginal bleeding: Secondary | ICD-10-CM | POA: Diagnosis present

## 2013-10-13 DIAGNOSIS — N926 Irregular menstruation, unspecified: Secondary | ICD-10-CM

## 2013-10-13 DIAGNOSIS — D649 Anemia, unspecified: Secondary | ICD-10-CM

## 2013-10-13 LAB — CBC
Platelets: 185 10*3/uL (ref 150–400)
RBC: 3.33 MIL/uL — ABNORMAL LOW (ref 3.87–5.11)
RDW: 19.9 % — ABNORMAL HIGH (ref 11.5–15.5)
WBC: 5.4 10*3/uL (ref 4.0–10.5)

## 2013-10-13 LAB — PREPARE RBC (CROSSMATCH)

## 2013-10-13 LAB — POCT PREGNANCY, URINE: Preg Test, Ur: NEGATIVE

## 2013-10-13 MED ORDER — PRENATAL MULTIVITAMIN CH
1.0000 | ORAL_TABLET | Freq: Every day | ORAL | Status: DC
  Administered 2013-10-14: 1 via ORAL
  Filled 2013-10-13 (×2): qty 1

## 2013-10-13 MED ORDER — ACETAMINOPHEN 325 MG PO TABS
650.0000 mg | ORAL_TABLET | Freq: Once | ORAL | Status: AC
  Administered 2013-10-13: 650 mg via ORAL
  Filled 2013-10-13: qty 2

## 2013-10-13 MED ORDER — DIPHENHYDRAMINE HCL 25 MG PO CAPS
25.0000 mg | ORAL_CAPSULE | Freq: Once | ORAL | Status: AC
  Administered 2013-10-13: 25 mg via ORAL
  Filled 2013-10-13: qty 1

## 2013-10-13 MED ORDER — IBUPROFEN 600 MG PO TABS: 600.0000 mg | ORAL_TABLET | Freq: Four times a day (QID) | ORAL | Status: DC | PRN

## 2013-10-13 MED ORDER — MEDROXYPROGESTERONE ACETATE 10 MG PO TABS
10.0000 mg | ORAL_TABLET | Freq: Two times a day (BID) | ORAL | Status: DC
  Administered 2013-10-13 – 2013-10-14 (×3): 10 mg via ORAL
  Filled 2013-10-13 (×5): qty 1

## 2013-10-13 MED ORDER — LACTATED RINGERS IV SOLN: INTRAVENOUS | Status: DC

## 2013-10-13 MED ORDER — SODIUM CHLORIDE 0.9 % IV SOLN
INTRAVENOUS | Status: DC
  Administered 2013-10-13: 13:00:00 via INTRAVENOUS

## 2013-10-13 NOTE — MAU Provider Note (Signed)
History     CSN: [id number]  Arrival date and time: 10/13/13 1134   First Provider Initiated Contact with Patient 10/13/13 1206      Chief Complaint  Patient presents with  . Vaginal Bleeding  . Dizziness   HPI [name] is a 52 y.o. G1P1001 who presents to MAU today with complaint of feeling weak, dizzy and faint since this morning. The patient states that she has a history of fibroids and DUB. She states that since June she has had very light periods. She assumed that she was going through menopause. She states that Monday she started having very heavy bleeding. She states that she had to change a tampon 5x in an hour. This morning her bleeding has slowed down significantly. There is no bleeding evident on her pad upon arrival in MAU. The patient denies fever or abdominal pain.   OB History   Grav Para Term Preterm Abortions TAB SAB Ect Mult Living   1 1 1  0 0 0 0 0 0 1      Past Medical History  Diagnosis Date  . Heart murmur   . Anemia   . Blood transfusion without reported diagnosis   . Fibroids     Past Surgical History  Procedure Laterality Date  . Tonsillectomy      Family History  Problem Relation Age of Onset  . Heart disease Mother     History  Substance Use Topics  . Smoking status: Never Smoker   . Smokeless tobacco: Never Used  . Alcohol Use: No    Allergies: No Known Allergies  Prescriptions prior to admission  Medication Sig Dispense Refill  . aspirin EC 81 MG tablet Take 81 mg by mouth daily.      . diphenhydrAMINE (BENADRYL) 25 MG tablet Take 25 mg by mouth every 6 (six) hours as needed. For allergies      . Fe Fum-FePoly-FA-Vit C-Vit B3 (INTEGRA F) 125-1 MG CAPS Take 1 capsule by mouth daily.  30 capsule  6  . ferrous fumarate (HEMOCYTE - 106 MG FE) 325 (106 FE) MG TABS Take 1 tablet by mouth.      . Multiple Vitamin (MULTIVITAMIN) capsule Take 1 capsule by mouth daily.      . calcium-vitamin D 250-100 MG-UNIT per tablet Take 1  tablet by mouth 2 (two) times daily.      Marland Kitchen HYDROcodone-acetaminophen (NORCO/VICODIN) 5-325 MG per tablet Take 1-2 tablets by mouth every 4 (four) hours as needed.  30 tablet  0  . megestrol (MEGACE) 40 MG tablet Take 1 tablet (40 mg total) by mouth daily.  30 tablet  1    Review of Systems  Constitutional: Positive for malaise/fatigue. Negative for fever.  Gastrointestinal: Negative for nausea, vomiting and abdominal pain.  Genitourinary:       + vaginal bleeding  Neurological: Positive for dizziness and weakness. Negative for loss of consciousness.   Physical Exam   Blood pressure 138/68, pulse 91, temperature 97.6 F (36.4 C), temperature source Oral, resp. rate 18, height 5' 6.5" (1.689 m), weight 222 lb 9.6 oz (100.971 kg), SpO2 100.00%.  Physical Exam  Constitutional: She is oriented to person, place, and time. She appears well-developed and well-nourished. No distress.  HENT:  Head: Normocephalic and atraumatic.  Cardiovascular: Normal rate, regular rhythm and normal heart sounds.   Respiratory: Effort normal and breath sounds normal. No respiratory distress.  GI: Soft. Bowel sounds are normal. She exhibits no distension and no mass.  There is no tenderness. There is no rebound and no guarding.  Neurological: She is alert and oriented to person, place, and time.  Skin: Skin is warm and dry. No erythema. No pallor.  Psychiatric: She has a normal mood and affect.   Results for orders placed during the hospital encounter of 10/13/13 (from the past 24 hour(s))  CBC     Status: Abnormal   Collection Time    10/13/13 12:00 PM      Result Value Range   WBC 5.4  4.0 - 10.5 K/uL   RBC 3.33 (*) 3.87 - 5.11 MIL/uL   Hemoglobin REPEATED TO VERIFY  12.0 - 15.0 g/dL   HCT 45.4 (*) 09.8 - 11.9 %   MCV 58.9 (*) 78.0 - 100.0 fL   MCH 15.3 (*) 26.0 - 34.0 pg   MCHC 26.0 (*) 30.0 - 36.0 g/dL   RDW 14.7 (*) 82.9 - 56.2 %   Platelets 185  150 - 400 K/uL  POCT PREGNANCY, URINE     Status:  None   Collection Time    10/13/13 12:29 PM      Result Value Range   Preg Test, Ur NEGATIVE  NEGATIVE    MAU Course  Procedures None  MDM CBC today RN was called with critical value Hgb of 5.1 Discussed patient with Dr. Jolayne Panther. Admit patient to Women's Unit for blood transfusion.  Assessment and Plan  A: Anemia  P: Admit to Women's Unit for blood tranfusion  Freddi Starr, PA-C  10/13/2013, 12:35 PM

## 2013-10-13 NOTE — H&P (Signed)
See MAU note. 

## 2013-10-13 NOTE — Progress Notes (Signed)
CRITICAL VALUE ALERT  Critical value received:  hgb 5.1  Date of notification:  10/13/2013  Time of notification:  12.25  Critical value read back:yes  Nurse who received alert:  Darlen Gledhill Cindie Laroche  MD notified (1st page):  Joseph Berkshire, rn  Time of first page:    MD notified (2nd page):  Time of second page:  Responding MD:    Time MD responded:  12.26

## 2013-10-13 NOTE — MAU Provider Note (Signed)
Attestation of Attending Supervision of Advanced Practitioner (CNM/NP): Evaluation and management procedures were performed by the Advanced Practitioner under my supervision and collaboration.  I have reviewed the Advanced Practitioner's note and chart, and I agree with the management and plan.  Katherine Harvey 10/13/2013 5:03 PM

## 2013-10-13 NOTE — MAU Note (Signed)
Patient states she has a history of fibroids, irregular periods. Denies pain. Feels lightheaded and dizzy.

## 2013-10-14 LAB — CBC
HCT: 26.1 % — ABNORMAL LOW (ref 36.0–46.0)
Hemoglobin: 7.8 g/dL — ABNORMAL LOW (ref 12.0–15.0)
MCH: 19.6 pg — ABNORMAL LOW (ref 26.0–34.0)
MCHC: 29.9 g/dL — ABNORMAL LOW (ref 30.0–36.0)
MCV: 65.6 fL — ABNORMAL LOW (ref 78.0–100.0)
RBC: 3.98 MIL/uL (ref 3.87–5.11)

## 2013-10-14 LAB — TYPE AND SCREEN
ABO/RH(D): B POS
Donor AG Type: NEGATIVE
Donor AG Type: NEGATIVE
PT AG Type: NEGATIVE
Unit division: 0

## 2013-10-14 MED ORDER — PRENATAL MULTIVITAMIN CH: 1.0000 | ORAL_TABLET | Freq: Every day | ORAL | Status: DC

## 2013-10-14 MED ORDER — MEDROXYPROGESTERONE ACETATE 10 MG PO TABS: 10.0000 mg | ORAL_TABLET | Freq: Two times a day (BID) | ORAL | Status: DC

## 2013-10-14 MED ORDER — FERROUS SULFATE 325 (65 FE) MG PO TABS: 325.0000 mg | ORAL_TABLET | Freq: Two times a day (BID) | ORAL | Status: DC

## 2013-10-14 MED ORDER — FERROUS FUMARATE 325 (106 FE) MG PO TABS: 1.0000 | ORAL_TABLET | Freq: Two times a day (BID) | ORAL | Status: DC

## 2013-10-14 NOTE — Progress Notes (Signed)
UR completed 

## 2013-10-14 NOTE — Discharge Summary (Signed)
Katherine Harvey 52 y.o.G1P1001  DOA: 10/13/13 DOD: 10/14/13  Admission Dx: Myomatous Uterus, DUB, Severe CBL/ iron deficiency anemia Discharge Dx: Same  SUBJECTIVE:  HPI: [name] is a 52 y.o. G1P1001 who presented to MAU 10/13/13 with complaint of feeling weak, dizzy and faint x 1day.  She gave hx of light menses until 2 days ago when she began bleeding heavily, but was tapering. Has not taken Megace though has an unfilled Rx.   Past Medical History  Diagnosis Date  . Heart murmur   . Anemia   . Blood transfusion without reported diagnosis   . Fibroids     Past Surgical History  Procedure Laterality Date  . Tonsillectomy     No current facility-administered medications on file prior to encounter.   Current Outpatient Prescriptions on File Prior to Encounter  Medication Sig Dispense Refill  . calcium-vitamin D 250-100 MG-UNIT per tablet Take 1 tablet by mouth 2 (two) times daily.      . megestrol (MEGACE) 40 MG tablet Take 1 tablet (40 mg total) by mouth daily.  30 tablet  1    OBJECTIVE: Temp:  [97.9 F (36.6 C)-98.9 F (37.2 C)] 98.2 F (36.8 C) (11/20 1153) Pulse Rate:  [68-97] 68 (11/20 1153) Resp:  [16-20] 18 (11/20 1153) BP: (110-157)/(50-74) 121/62 mmHg (11/20 1153) SpO2:  [98 %-100 %] 98 % (11/20 1153)  Physical Exam: General: WN WD in NAD Cor: RRR w/o murmur Lungs: CTA Abd: soft, NT  Results for orders placed during the hospital encounter of 10/13/13 (from the past 48 hour(s))  CBC     Status: Abnormal   Collection Time    10/13/13 12:00 PM      Result Value Range   WBC 5.4  4.0 - 10.5 K/uL   RBC 3.33 (*) 3.87 - 5.11 MIL/uL   Hemoglobin 5.1 (*) 12.0 - 15.0 g/dL   Comment: REPEATED TO VERIFY     CRITICAL RESULT CALLED TO, READ BACK BY AND VERIFIED WITH:     CORRECTED RESULTS CALLED TO:     P.OKEHIE AT 1224 BY JPEEBLES     CORRECTED ON 11/19 AT 1424: PREVIOUSLY REPORTED AS REPEATED TO VERIFY CRITICAL RESULT CALLED TO, READ BACK BY AND  VERIFIED WITH: P.OKEHIE AT 1224 ON 11.19.14 BY JPEEBLES   HCT 19.6 (*) 36.0 - 46.0 %   MCV 58.9 (*) 78.0 - 100.0 fL   MCH 15.3 (*) 26.0 - 34.0 pg   MCHC 26.0 (*) 30.0 - 36.0 g/dL   RDW 16.1 (*) 09.6 - 04.5 %   Platelets 185  150 - 400 K/uL  POCT PREGNANCY, URINE     Status: None   Collection Time    10/13/13 12:29 PM      Result Value Range   Preg Test, Ur NEGATIVE  NEGATIVE   Comment:            THE SENSITIVITY OF THIS     METHODOLOGY IS >24 mIU/mL  TYPE AND SCREEN     Status: None   Collection Time    10/13/13 12:52 PM      Result Value Range   ABO/RH(D) B POS     Antibody Screen POS     Sample Expiration 10/16/2013     Antibody Identification ANTI-K     DAT, IgG NEG     PT AG Type NEGATIVE FOR KELL ANTIGEN     Unit Number [id number]     Blood Component Type RED CELLS,LR  Unit division 00     Status of Unit ISSUED,FINAL     Donor AG Type NEGATIVE FOR KELL ANTIGEN     Transfusion Status OK TO TRANSFUSE     Crossmatch Result COMPATIBLE     Unit Number [id number]     Blood Component Type RED CELLS,LR     Unit division 00     Status of Unit ISSUED,FINAL     Donor AG Type NEGATIVE FOR KELL ANTIGEN     Transfusion Status OK TO TRANSFUSE     Crossmatch Result COMPATIBLE     Unit Number [id number]     Blood Component Type RED CELLS,LR     Unit division 00     Status of Unit ISSUED,FINAL     Donor AG Type NEGATIVE FOR KELL ANTIGEN     Transfusion Status OK TO TRANSFUSE     Crossmatch Result COMPATIBLE    PREPARE RBC (CROSSMATCH)     Status: None   Collection Time    10/13/13  1:00 PM      Result Value Range   Order Confirmation ORDER PROCESSED BY BLOOD BANK    CBC     Status: Abnormal   Collection Time    10/14/13  2:29 AM      Result Value Range   WBC 6.4  4.0 - 10.5 K/uL   RBC 3.98  3.87 - 5.11 MIL/uL   Hemoglobin 7.8 (*) 12.0 - 15.0 g/dL   Comment: DELTA CHECK NOTED     REPEATED TO VERIFY     POST TRANSFUSION SPECIMEN   HCT 26.1 (*) 36.0 - 46.0 %    MCV 65.6 (*) 78.0 - 100.0 fL   Comment: DELTA CHECK NOTED     REPEATED TO VERIFY     POST TRANSFUSION SPECIMEN   MCH 19.6 (*) 26.0 - 34.0 pg   MCHC 29.9 (*) 30.0 - 36.0 g/dL   RDW 46.9 (*) 62.9 - 52.8 %   Platelets 160  150 - 400 K/uL   Comment: SPECIMEN CHECKED FOR CLOTS     REPEATED TO [facility] Course:  She was admitted for blood transfusion and received 4u PRBC. Tolerated well. On DOD she felt better and was not having any orthostatic sx. while ambulating.   ASSESSMENT/PLAN: Fibroid uterus, DUB, severe anemia    Medication List    STOP taking these medications       aspirin EC 81 MG tablet     diphenhydrAMINE 25 MG tablet  Commonly known as:  BENADRYL     HYDROcodone-acetaminophen 5-325 MG per tablet  Commonly known as:  NORCO/VICODIN     INTEGRA F 125-1 MG Caps     megestrol 40 MG tablet  Commonly known as:  MEGACE     multivitamin capsule      TAKE these medications       calcium-vitamin D 250-100 MG-UNIT per tablet  Take 1 tablet by mouth 2 (two) times daily.     ferrous fumarate 325 (106 FE) MG Tabs tablet  Commonly known as:  HEMOCYTE - 106 mg FE  Take 1 tablet (106 mg of iron total) by mouth 2 (two) times daily.     ferrous sulfate 325 (65 FE) MG tablet  Commonly known as:  FERROUSUL  Take 1 tablet (325 mg total) by mouth 2 (two) times daily.     medroxyPROGESTERone 10 MG tablet  Commonly known as:  PROVERA  Take 1 tablet (10 mg total) by mouth  2 (two) times daily.     prenatal multivitamin Tabs tablet  Take 1 tablet by mouth daily at 12 noon.       Follow-up Information   Follow up with [facility]. (Someone from [facility] will call you with an appointment)    Specialty:  Obstetrics and Gynecology   Contact information:   [address] [place] [address] [phone]

## 2013-10-18 NOTE — Discharge Summary (Signed)
Pt should only take one of the iron pill that was ordered.  Katherine Degan H.

## 2013-10-24 ENCOUNTER — Telehealth: Payer: Self-pay | Admitting: Obstetrics and Gynecology

## 2013-10-24 NOTE — Telephone Encounter (Signed)
TC to patient to verifying she is taking Fe++ supplement 1 tab bid and to ask how she's doing on the Provera.  Message left asking her to call back.

## 2013-10-25 ENCOUNTER — Telehealth: Payer: Self-pay | Admitting: Obstetrics and Gynecology

## 2013-11-27 ENCOUNTER — Other Ambulatory Visit (HOSPITAL_COMMUNITY): Payer: Self-pay | Admitting: Obstetrics and Gynecology

## 2013-12-06 ENCOUNTER — Other Ambulatory Visit (HOSPITAL_COMMUNITY): Payer: Self-pay | Admitting: Obstetrics and Gynecology

## 2014-02-22 ENCOUNTER — Other Ambulatory Visit (HOSPITAL_COMMUNITY): Payer: Self-pay | Admitting: Obstetrics and Gynecology

## 2014-03-15 ENCOUNTER — Other Ambulatory Visit (HOSPITAL_COMMUNITY): Payer: Self-pay | Admitting: Obstetrics and Gynecology

## 2014-04-11 ENCOUNTER — Other Ambulatory Visit (HOSPITAL_COMMUNITY): Payer: Self-pay | Admitting: Obstetrics and Gynecology

## 2014-04-25 ENCOUNTER — Other Ambulatory Visit (HOSPITAL_COMMUNITY): Payer: Self-pay | Admitting: Obstetrics and Gynecology

## 2014-05-30 ENCOUNTER — Other Ambulatory Visit (HOSPITAL_COMMUNITY): Payer: Self-pay | Admitting: Obstetrics and Gynecology

## 2014-06-28 ENCOUNTER — Other Ambulatory Visit (HOSPITAL_COMMUNITY): Payer: Self-pay | Admitting: Obstetrics and Gynecology

## 2014-08-31 ENCOUNTER — Other Ambulatory Visit (HOSPITAL_COMMUNITY): Payer: Self-pay | Admitting: Obstetrics and Gynecology

## 2014-09-26 ENCOUNTER — Encounter (HOSPITAL_COMMUNITY): Payer: Self-pay

## 2014-11-24 ENCOUNTER — Other Ambulatory Visit: Payer: Self-pay | Admitting: Obstetrics and Gynecology

## 2014-12-10 ENCOUNTER — Other Ambulatory Visit: Payer: Self-pay | Admitting: Obstetrics and Gynecology

## 2015-02-07 ENCOUNTER — Other Ambulatory Visit: Payer: Self-pay | Admitting: Obstetrics and Gynecology

## 2015-02-16 ENCOUNTER — Other Ambulatory Visit: Payer: Self-pay | Admitting: Obstetrics and Gynecology

## 2015-03-21 ENCOUNTER — Other Ambulatory Visit: Payer: Self-pay | Admitting: Obstetrics and Gynecology

## 2015-04-17 ENCOUNTER — Other Ambulatory Visit: Payer: Self-pay | Admitting: Obstetrics and Gynecology

## 2015-05-09 ENCOUNTER — Other Ambulatory Visit: Payer: Self-pay | Admitting: Obstetrics and Gynecology

## 2015-05-15 ENCOUNTER — Other Ambulatory Visit: Payer: Self-pay | Admitting: Obstetrics and Gynecology

## 2015-09-13 ENCOUNTER — Other Ambulatory Visit: Payer: Self-pay | Admitting: Obstetrics and Gynecology

## 2015-10-11 ENCOUNTER — Other Ambulatory Visit: Payer: Self-pay | Admitting: Obstetrics and Gynecology

## 2015-11-07 ENCOUNTER — Other Ambulatory Visit: Payer: Self-pay | Admitting: Obstetrics and Gynecology

## 2015-11-09 ENCOUNTER — Encounter: Payer: Self-pay | Admitting: Obstetrics and Gynecology

## 2015-11-09 ENCOUNTER — Ambulatory Visit (INDEPENDENT_AMBULATORY_CARE_PROVIDER_SITE_OTHER): Payer: Self-pay | Admitting: Obstetrics and Gynecology

## 2015-11-09 VITALS — BP 129/63 | HR 63 | Temp 98.4°F | Wt 236.0 lb

## 2015-11-09 DIAGNOSIS — N938 Other specified abnormal uterine and vaginal bleeding: Secondary | ICD-10-CM

## 2015-11-09 DIAGNOSIS — D259 Leiomyoma of uterus, unspecified: Secondary | ICD-10-CM

## 2015-11-09 MED ORDER — MEGESTROL ACETATE 20 MG PO TABS: 20.0000 mg | ORAL_TABLET | Freq: Two times a day (BID) | ORAL | Status: DC

## 2015-11-09 NOTE — Progress Notes (Signed)
Patient ID: Katherine Harvey, female   DOB: 17-Aug-1961, 54 y.o.   MRN: [id number] 54 yo G1P1 presents today for the evaluation of DUB. Patient with h/o DUB and fibroid uterus and has last been evaluated for it in 2013. Patient states that she has been doing well since the initiation of provera daily without any vaginal bleeding but last month has experienced a prolonged period. She states that it lasted the entire month and is now reduced to spotting. Patient denies any pelvic pain or passage of clots.  Past Medical History  Diagnosis Date  . Heart murmur   . Anemia   . Blood transfusion without reported diagnosis   . Fibroids    Past Surgical History  Procedure Laterality Date  . Tonsillectomy     Family History  Problem Relation Age of Onset  . Heart disease Mother    Social History  Substance Use Topics  . Smoking status: Never Smoker   . Smokeless tobacco: Never Used  . Alcohol Use: No   ROS See pertinent in HPI  Blood pressure 129/63, pulse 63, temperature 98.4 F (36.9 C), weight 236 lb (107.049 kg). GENERAL: Well-developed, well-nourished female in no acute distress.  ABDOMEN: Soft, nontender, nondistended. Palpable 20-week size uterus. PELVIC: declined by patient EXTREMITIES: No cyanosis, clubbing, or edema, 2+ distal pulses.  A/P 54 yo with fibroid uterus and DUB - Discussed with the patient the need to repeat endometrial biopsy (last one in 2013). Patient declined - Patient is not interested in definitive treatment with hysterectomy at this time - She requested no other intervention than continued progesterone. Rx Megace provided. - Advised the patient to return if bleeding persists despite the megace for repeat endometrial biopsy. Patient verbalized understanding

## 2016-09-24 ENCOUNTER — Other Ambulatory Visit: Payer: Self-pay | Admitting: Obstetrics and Gynecology

## 2016-10-21 ENCOUNTER — Other Ambulatory Visit: Payer: Self-pay | Admitting: Obstetrics and Gynecology

## 2016-11-20 ENCOUNTER — Ambulatory Visit: Payer: Self-pay | Admitting: Obstetrics and Gynecology

## 2016-12-24 ENCOUNTER — Ambulatory Visit (INDEPENDENT_AMBULATORY_CARE_PROVIDER_SITE_OTHER): Payer: Self-pay | Admitting: Obstetrics and Gynecology

## 2016-12-24 ENCOUNTER — Encounter: Payer: Self-pay | Admitting: Obstetrics and Gynecology

## 2016-12-24 ENCOUNTER — Other Ambulatory Visit (HOSPITAL_COMMUNITY)
Admission: RE | Admit: 2016-12-24 | Discharge: 2016-12-24 | Disposition: A | Discharge status: Home / Self Care | Payer: Self-pay | Source: Ambulatory Visit | Attending: Obstetrics and Gynecology | Admitting: Obstetrics and Gynecology

## 2016-12-24 VITALS — BP 126/67 | HR 75 | Wt 232.2 lb

## 2016-12-24 DIAGNOSIS — N938 Other specified abnormal uterine and vaginal bleeding: Secondary | ICD-10-CM | POA: Insufficient documentation

## 2016-12-24 MED ORDER — MEGESTROL ACETATE 40 MG PO TABS: 40.0000 mg | ORAL_TABLET | Freq: Two times a day (BID) | ORAL | 6 refills | Status: DC

## 2016-12-24 NOTE — Progress Notes (Signed)
56 yo G1P1 presenting today for the evaluation of DUB. Patient was last seen in 10/2015. Patient with history of fibroid uterus and DUB medically managed with megace. Patient states that she has done well with the megace but in the past month she had a 3 week period, heavy in flow. She desires to continue on Megace. She is without any other complaints. She denies pelvic pain or abnormal discharge  Past Medical History:  Diagnosis Date  . Anemia   . Blood transfusion without reported diagnosis   . Fibroids   . Heart murmur    Past Surgical History:  Procedure Laterality Date  . TONSILLECTOMY     Family History  Problem Relation Age of Onset  . Heart disease Mother    Social History  Substance Use Topics  . Smoking status: Never Smoker  . Smokeless tobacco: Never Used  . Alcohol use No   ROS See pertinent in HPI  Blood pressure 126/67, pulse 75, weight 232 lb 3.2 oz (105.3 kg). GENERAL: Well-developed, well-nourished female in no acute distress.  ABDOMEN: Soft, nontender, nondistended. No organomegaly. PELVIC: Normal external female genitalia. Vagina is pink and rugated.  Normal discharge. Normal appearing cervix. Uterus is 16-weeks in size. No adnexal mass or tenderness. EXTREMITIES: No cyanosis, clubbing, or edema, 2+ distal pulses.  A/P 56 yo with DUB - patient agreed to pelvic ultrasound - pelvic also agreed to endometrial biopsy ENDOMETRIAL BIOPSY     The indications for endometrial biopsy were reviewed.   Risks of the biopsy including cramping, bleeding, infection, uterine perforation, inadequate specimen and need for additional procedures  were discussed. The patient states she understands and agrees to undergo procedure today. Consent was signed. Time out was performed. Urine HCG was negative. A sterile speculum was placed in the patient's vagina and the cervix was prepped with Betadine. A single-toothed tenaculum was placed on the anterior lip of the cervix to stabilize  it. The uterine cavity was sounded to a depth of 10 cm using the uterine sound. The 3 mm pipelle was introduced into the endometrial cavity without difficulty, 2 passes were made.  A  moderate amount of tissue was  sent to pathology. The instruments were removed from the patient's vagina. Minimal bleeding from the cervix was noted. The patient tolerated the procedure well.  Routine post-procedure instructions were given to the patient. The patient will follow up in two weeks to review the results and for further management.   -Patient will be contacted with results - refill on megace provided

## 2017-01-27 ENCOUNTER — Telehealth: Payer: Self-pay | Admitting: *Deleted

## 2017-01-27 NOTE — Telephone Encounter (Signed)
Per message from Dr. Elly Modena need to find out if patient had pelvic US elsewhere - if not  make appointment for pelvic US and call patient with appt.   I called patient and she was waiting for appt.- I made appt and gave appt to her.

## 2017-01-29 ENCOUNTER — Encounter: Payer: Self-pay | Admitting: General Practice

## 2017-01-30 ENCOUNTER — Ambulatory Visit (HOSPITAL_COMMUNITY)
Admission: RE | Admit: 2017-01-30 | Discharge: 2017-01-30 | Disposition: A | Discharge status: Home / Self Care | Payer: PRIVATE HEALTH INSURANCE | Source: Ambulatory Visit | Attending: Obstetrics and Gynecology | Admitting: Obstetrics and Gynecology

## 2017-01-30 DIAGNOSIS — D250 Submucous leiomyoma of uterus: Secondary | ICD-10-CM | POA: Insufficient documentation

## 2017-01-30 DIAGNOSIS — N938 Other specified abnormal uterine and vaginal bleeding: Secondary | ICD-10-CM | POA: Diagnosis not present

## 2017-02-12 ENCOUNTER — Ambulatory Visit: Payer: 59 | Admitting: Obstetrics and Gynecology

## 2017-03-14 ENCOUNTER — Ambulatory Visit: Payer: 59 | Admitting: Obstetrics and Gynecology

## 2017-04-16 ENCOUNTER — Ambulatory Visit: Payer: 59 | Admitting: Obstetrics and Gynecology

## 2017-05-13 ENCOUNTER — Ambulatory Visit (INDEPENDENT_AMBULATORY_CARE_PROVIDER_SITE_OTHER): Payer: PRIVATE HEALTH INSURANCE | Admitting: Obstetrics and Gynecology

## 2017-05-13 ENCOUNTER — Encounter: Payer: Self-pay | Admitting: Obstetrics and Gynecology

## 2017-05-13 VITALS — BP 136/61 | HR 81 | Ht 67.5 in | Wt 240.1 lb

## 2017-05-13 DIAGNOSIS — D259 Leiomyoma of uterus, unspecified: Secondary | ICD-10-CM

## 2017-05-13 DIAGNOSIS — Z712 Person consulting for explanation of examination or test findings: Secondary | ICD-10-CM

## 2017-05-13 DIAGNOSIS — Z1231 Encounter for screening mammogram for malignant neoplasm of breast: Secondary | ICD-10-CM

## 2017-05-13 MED ORDER — MEGESTROL ACETATE 40 MG PO TABS: 40.0000 mg | ORAL_TABLET | Freq: Two times a day (BID) | ORAL | 6 refills | Status: DC

## 2017-05-13 NOTE — Progress Notes (Signed)
56 yo G1P1001 here to discuss test results. Patient reports feeling well and denies any further episode of vaginal bleeding while on megace. She desires to continue with this form of medical management  11/2016 Endometrial biopsy- no endometrial tissue in sample  01/2017 ultrasound FINDINGS: Uterus  Measurements: 14.9 x 9.0 x 10.5 cm. Multiple fibroids are identified. The largest fibroid is in the lower uterine segment measuring 7.4 x 6.4 x 6.4 cm. There is a sub mucosal fibroid which appears peripherally calcified in the distal uterine segment measuring 3.6 x 3.4 x 3.1 cm. Left anterior submucosal fibroid measures 4.9 x 4.1 x 3.8 cm.  Endometrium  Thickness: Not visualize secondary to fibroids. Submucosal fibroids as above.  Right ovary  Measurements: 2.5 x 1.6 x 1.3 cm. Normal appearance/no adnexal mass.  Left ovary  Measurements: 3.2 x 2.1 x 2.1 cm. Normal appearance/no adnexal mass.  Other findings  No abnormal free fluid.  IMPRESSION: 1. Enlarged fibroid uterus 2. Submucosal fibroids are noted which may account for patient's dysfunctional uterine bleeding. Consider sonohysterogram for further evaluation, prior to hysteroscopy or endometrial biopsy.   Electronically Signed   By: Kerby Moors M.D.   On: 01/31/2017 08:15  A/P 56 yo with DUB medically managed with megace - Discussed results of endometrial biopsy- patient declined repeat endometrial biopsy - Discussed results of ultrasound and surgical intervention- patient desires to continue with megace but understands that a hysterectomy may be necessary if medical management fails - Refill on Megace provided - Screening mammogram ordered - patient reports normal pap smear with health department - RTC prn

## 2017-06-27 NOTE — Telephone Encounter (Signed)
See note

## 2017-09-25 DIAGNOSIS — K029 Dental caries, unspecified: Secondary | ICD-10-CM | POA: Diagnosis not present

## 2017-09-25 DIAGNOSIS — K0889 Other specified disorders of teeth and supporting structures: Secondary | ICD-10-CM | POA: Diagnosis present

## 2017-09-25 DIAGNOSIS — Z79899 Other long term (current) drug therapy: Secondary | ICD-10-CM | POA: Diagnosis not present

## 2017-09-25 DIAGNOSIS — I10 Essential (primary) hypertension: Secondary | ICD-10-CM | POA: Diagnosis not present

## 2017-09-26 ENCOUNTER — Emergency Department (HOSPITAL_COMMUNITY)
Admission: EM | Admit: 2017-09-26 | Discharge: 2017-09-26 | Disposition: A | Discharge status: Home / Self Care | Payer: BC Managed Care – PPO | Attending: Emergency Medicine | Admitting: Emergency Medicine

## 2017-09-26 DIAGNOSIS — K0889 Other specified disorders of teeth and supporting structures: Secondary | ICD-10-CM

## 2017-09-26 DIAGNOSIS — K029 Dental caries, unspecified: Secondary | ICD-10-CM

## 2017-09-26 MED ORDER — PENICILLIN V POTASSIUM 500 MG PO TABS: 500.0000 mg | ORAL_TABLET | Freq: Four times a day (QID) | ORAL | 0 refills | Status: DC

## 2017-09-26 MED ORDER — ACETAMINOPHEN 325 MG PO TABS
650.0000 mg | ORAL_TABLET | Freq: Once | ORAL | Status: AC
  Administered 2017-09-26: 650 mg via ORAL
  Filled 2017-09-26: qty 2

## 2017-09-26 MED ORDER — NAPROXEN 375 MG PO TABS: 375.0000 mg | ORAL_TABLET | Freq: Two times a day (BID) | ORAL | 0 refills | Status: AC

## 2017-09-26 NOTE — ED Notes (Signed)
Bed: WA05 Expected date:  Expected time:  Means of arrival:  Comments: 

## 2017-09-26 NOTE — ED Provider Notes (Signed)
[facility] DEPT Provider Note   CSN: [id number] Arrival date & time: 09/25/17  2315     History   Chief Complaint Chief Complaint  Patient presents with  . Dental Pain    HPI Katherine Harvey is a 56 y.o. female.  Katherine Harvey is a 56 y.o. Female who presents to the ED complaining of right upper dental pain ongoing for the past 2 days.  She reports her pain is worse with eating and cold liquids.  She is tried using Orajel that helped only for about 30 minutes.  No other treatments attempted prior to arrival.  She has had no fevers.  No mouth discharge or sore throat.  No trouble swallowing.  No neck pain.  She does not have a dentist.   The history is provided by the patient and medical records. No language interpreter was used.  Dental Pain      Past Medical History:  Diagnosis Date  . Anemia   . Blood transfusion without reported diagnosis   . Fibroids   . Heart murmur     Patient Active Problem List   Diagnosis Date Noted  . DUB (dysfunctional uterine bleeding) 11/09/2015  . Anemia 09/10/2012  . Hypertension 09/10/2012  . Cellulitis 09/10/2012  . Fibroids 05/07/2012    Past Surgical History:  Procedure Laterality Date  . TONSILLECTOMY      OB History    Gravida Para Term Preterm AB Living   1 1 1  0 0 1   SAB TAB Ectopic Multiple Live Births   0 0 0 0 1       Home Medications    Prior to Admission medications   Medication Sig Start Date End Date Taking? Authorizing Provider  calcium-vitamin D 250-100 MG-UNIT per tablet Take 1 tablet by mouth 2 (two) times daily.    [provider]  ferrous sulfate 325 (65 FE) MG tablet TAKE 1 TABLET BY MOUTH TWICE A DAY 05/15/15   Poe, Deirdre C, CNM  medroxyPROGESTERone (PROVERA) 10 MG tablet TAKE 1 TABLET BY MOUTH TWICE A DAY 04/19/15   Poe, Deirdre C, CNM  megestrol (MEGACE) 40 MG tablet Take 1 tablet (40 mg total) by mouth 2 (two) times daily. 05/13/17   Constant, Peggy, MD    naproxen (NAPROSYN) 375 MG tablet Take 1 tablet (375 mg total) by mouth 2 (two) times daily with a meal. 09/26/17   Waynetta Pean, PA-C  penicillin v potassium (VEETID) 500 MG tablet Take 1 tablet (500 mg total) by mouth 4 (four) times daily. 09/26/17   Waynetta Pean, PA-C  Prenatal Vit-Fe Fumarate-FA (PRENATAL MULTIVITAMIN) TABS tablet Take 1 tablet by mouth daily at 12 noon. 10/14/13   Poe, Deirdre Loletha Grayer, CNM    Family History Family History  Problem Relation Age of Onset  . Heart disease Mother     Social History Social History  Substance Use Topics  . Smoking status: Never Smoker  . Smokeless tobacco: Never Used  . Alcohol use No     Allergies   Patient has no known allergies.   Review of Systems Review of Systems  Constitutional: Negative for chills and fever.  HENT: Positive for dental problem. Negative for drooling, ear pain, facial swelling, sore throat and trouble swallowing.   Eyes: Negative for pain and visual disturbance.  Musculoskeletal: Negative for neck pain and neck stiffness.  Skin: Negative for rash.  Neurological: Negative for headaches.     Physical Exam Updated Vital Signs BP  132/78 (BP Location: Left Arm)   Pulse 77   Temp 98.3 F (36.8 C) (Oral)   Resp 18   Ht 5\' 8"  (1.727 m)   Wt 98.9 kg (218 lb)   LMP 09/21/2017   SpO2 99%   BMI 33.15 kg/m   Physical Exam  Constitutional: She appears well-developed and well-nourished. No distress.  Non-toxic appearing.   HENT:  Head: Normocephalic and atraumatic.  Right Ear: External ear normal.  Left Ear: External ear normal.  Mouth/Throat: Oropharynx is clear and moist. No oropharyngeal exudate.  Tenderness to the right upper molar. Dental caries.  No discharge from the mouth. No facial swelling.  Uvula is midline without edema. Soft palate rises symmetrically. No tonsillar hypertrophy or exudates. Tongue protrusion is normal. No trismus. No PTA.   Eyes: Pupils are equal, round, and reactive to  light. Conjunctivae are normal. Right eye exhibits no discharge. Left eye exhibits no discharge.  Neck: Normal range of motion. Neck supple. No JVD present. No tracheal deviation present.  Cardiovascular: Normal rate, normal heart sounds and intact distal pulses.   Pulmonary/Chest: Effort normal and breath sounds normal. No stridor. No respiratory distress.  Lymphadenopathy:    She has no cervical adenopathy.  Neurological: She is alert. Coordination normal.  Skin: No rash noted. She is not diaphoretic.  Psychiatric: She has a normal mood and affect. Her behavior is normal.  Nursing note and vitals reviewed.    ED Treatments / Results  Labs (all labs ordered are listed, but only abnormal results are displayed) Labs Reviewed - No data to display  EKG  EKG Interpretation None       Radiology No results found.  Procedures Procedures (including critical care time)  Medications Ordered in ED Medications  acetaminophen (TYLENOL) tablet 650 mg (not administered)     Initial Impression / Assessment and Plan / ED Course  I have reviewed the triage vital signs and the nursing notes.  Pertinent labs & imaging results that were available during my care of the patient were reviewed by me and considered in my medical decision making (see chart for details).     This is a 56 y.o. Female who presents to the ED complaining of left upper dental pain ongoing for the past 2 days.   Patient with left upper toothache.  No gross abscess.  Exam unconcerning for Ludwig's angina or spread of infection.  Will treat with penicillin and pain medicine.  Urged patient to follow-up with dentist. Follow up given with Dentist Dr Radford Pax. I advised the patient to follow-up with their primary care provider this week. I advised the patient to return to the emergency department with new or worsening symptoms or new concerns. The patient verbalized understanding and agreement with plan.     Final Clinical  Impressions(s) / ED Diagnoses   Final diagnoses:  Pain, dental  Dental caries    New Prescriptions New Prescriptions   NAPROXEN (NAPROSYN) 375 MG TABLET    Take 1 tablet (375 mg total) by mouth 2 (two) times daily with a meal.   PENICILLIN V POTASSIUM (VEETID) 500 MG TABLET    Take 1 tablet (500 mg total) by mouth 4 (four) times daily.     Waynetta Pean, PA-C 44/81/85 [id number]    Delora Fuel, MD 97/02/63 [phone]

## 2017-09-26 NOTE — ED Triage Notes (Signed)
Patient complaining of toothache on the right upper side. Patient states this started two days ago.

## 2017-09-26 NOTE — ED Notes (Signed)
Bed: WA06 Expected date:  Expected time:  Means of arrival:  Comments: 

## 2017-11-20 ENCOUNTER — Encounter (HOSPITAL_COMMUNITY): Payer: Self-pay | Admitting: Emergency Medicine

## 2017-11-20 ENCOUNTER — Other Ambulatory Visit: Payer: Self-pay

## 2017-11-20 DIAGNOSIS — S8011XA Contusion of right lower leg, initial encounter: Secondary | ICD-10-CM | POA: Diagnosis not present

## 2017-11-20 DIAGNOSIS — Y92410 Unspecified street and highway as the place of occurrence of the external cause: Secondary | ICD-10-CM | POA: Insufficient documentation

## 2017-11-20 DIAGNOSIS — Z79899 Other long term (current) drug therapy: Secondary | ICD-10-CM | POA: Diagnosis not present

## 2017-11-20 DIAGNOSIS — Y998 Other external cause status: Secondary | ICD-10-CM | POA: Diagnosis not present

## 2017-11-20 DIAGNOSIS — Y9389 Activity, other specified: Secondary | ICD-10-CM | POA: Insufficient documentation

## 2017-11-20 DIAGNOSIS — I10 Essential (primary) hypertension: Secondary | ICD-10-CM | POA: Insufficient documentation

## 2017-11-20 DIAGNOSIS — S8012XA Contusion of left lower leg, initial encounter: Secondary | ICD-10-CM | POA: Insufficient documentation

## 2017-11-20 DIAGNOSIS — S8992XA Unspecified injury of left lower leg, initial encounter: Secondary | ICD-10-CM | POA: Diagnosis present
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
## 2017-11-21 ENCOUNTER — Emergency Department (HOSPITAL_COMMUNITY)
Admission: EM | Admit: 2017-11-21 | Discharge: 2017-11-21 | Disposition: A | Discharge status: Home / Self Care | Payer: No Typology Code available for payment source | Attending: Emergency Medicine | Admitting: Emergency Medicine

## 2017-11-21 DIAGNOSIS — S8010XA Contusion of unspecified lower leg, initial encounter: Secondary | ICD-10-CM
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
## 2018-08-11 ENCOUNTER — Other Ambulatory Visit: Payer: Self-pay

## 2018-08-11 ENCOUNTER — Encounter (HOSPITAL_COMMUNITY): Payer: Self-pay

## 2018-08-11 ENCOUNTER — Ambulatory Visit (HOSPITAL_COMMUNITY)
Admission: EM | Admit: 2018-08-11 | Discharge: 2018-08-11 | Disposition: A | Discharge status: Home / Self Care | Payer: BC Managed Care – PPO | Attending: Family Medicine | Admitting: Family Medicine

## 2018-08-11 DIAGNOSIS — S025XXA Fracture of tooth (traumatic), initial encounter for closed fracture: Secondary | ICD-10-CM | POA: Diagnosis not present

## 2018-08-11 DIAGNOSIS — K0889 Other specified disorders of teeth and supporting structures: Secondary | ICD-10-CM

## 2018-08-11 MED ORDER — IBUPROFEN 800 MG PO TABS
800.0000 mg | ORAL_TABLET | Freq: Three times a day (TID) | ORAL | 0 refills | Status: AC
Start: 2018-08-11 — End: ?

## 2018-08-11 MED ORDER — HYDROCODONE-ACETAMINOPHEN 5-325 MG PO TABS: 1.0000 | ORAL_TABLET | Freq: Four times a day (QID) | ORAL | 0 refills | Status: DC | PRN

## 2018-08-11 NOTE — Discharge Instructions (Signed)
Please use dental resource to contact offices to seek permenant treatment/relief.   For pain please take 600mg -800mg  of Ibuprofen every 8 hours, take with 1000 mg of Tylenol Extra strength every 8 hours. These are safe to take together. Please take with food.   I have also provided 2 days worth of stronger pain medication. This should only be used for severe pain. Do not drive or operate machinery while taking this medication.   Please return if you start to experience significant swelling of your face, experiencing fever.

## 2018-08-11 NOTE — ED Provider Notes (Signed)
[facility]    CSN: [id number] Arrival date & time: 08/11/18  1739     History   Chief Complaint Chief Complaint  Patient presents with  . Dental Pain    HPI Katherine Harvey is a 57 y.o. female history of hypertension, presenting today for evaluation of dental pain.  Patient states that earlier today she was walking, accidentally tripped over some blinds and fell forward onto the cement, she chipped an upper and lower tooth on the left side.  Since she has had pain around this these teeth.  She is taking ibuprofen with minimal relief.  She does not currently have dentistry set up.  She denies fevers.  Denies vision changes or pain with moving her eyes.  Denies headache.  Denies dizziness or lightheadedness after the fall.  Main discomfort is related to her teeth.  HPI  Past Medical History:  Diagnosis Date  . Anemia   . Blood transfusion without reported diagnosis   . Fibroids   . Heart murmur     Patient Active Problem List   Diagnosis Date Noted  . DUB (dysfunctional uterine bleeding) 11/09/2015  . Anemia 09/10/2012  . Hypertension 09/10/2012  . Cellulitis 09/10/2012  . Fibroids 05/07/2012    Past Surgical History:  Procedure Laterality Date  . TONSILLECTOMY      OB History    Gravida  1   Para  1   Term  1   Preterm  0   AB  0   Living  1     SAB  0   TAB  0   Ectopic  0   Multiple  0   Live Births  1            Home Medications    Prior to Admission medications   Medication Sig Start Date End Date Taking? Authorizing Provider  calcium-vitamin D 250-100 MG-UNIT per tablet Take 1 tablet by mouth 2 (two) times daily.    [provider]  ferrous sulfate 325 (65 FE) MG tablet TAKE 1 TABLET BY MOUTH TWICE A DAY 05/15/15   Poe, Deirdre C, CNM  HYDROcodone-acetaminophen (NORCO/VICODIN) 5-325 MG tablet Take 1 tablet by mouth every 6 (six) hours as needed. 08/11/18   Levenia Skalicky C, PA-C  ibuprofen (ADVIL,MOTRIN) 800 MG  tablet Take 1 tablet (800 mg total) by mouth 3 (three) times daily. 08/11/18   Tobi Leinweber C, PA-C  medroxyPROGESTERone (PROVERA) 10 MG tablet TAKE 1 TABLET BY MOUTH TWICE A DAY 04/19/15   Poe, Deirdre C, CNM  megestrol (MEGACE) 40 MG tablet Take 1 tablet (40 mg total) by mouth 2 (two) times daily. 05/13/17   Constant, Peggy, MD  naproxen (NAPROSYN) 375 MG tablet Take 1 tablet (375 mg total) by mouth 2 (two) times daily with a meal. 09/26/17   Waynetta Pean, PA-C  penicillin v potassium (VEETID) 500 MG tablet Take 1 tablet (500 mg total) by mouth 4 (four) times daily. 09/26/17   Waynetta Pean, PA-C  Prenatal Vit-Fe Fumarate-FA (PRENATAL MULTIVITAMIN) TABS tablet Take 1 tablet by mouth daily at 12 noon. 10/14/13   Poe, Deirdre Loletha Grayer, CNM    Family History Family History  Problem Relation Age of Onset  . Heart disease Mother     Social History Social History   Tobacco Use  . Smoking status: Never Smoker  . Smokeless tobacco: Never Used  Substance Use Topics  . Alcohol use: No  . Drug use: No     Allergies  Patient has no known allergies.   Review of Systems Review of Systems  Constitutional: Negative for fatigue and fever.  HENT: Positive for dental problem. Negative for congestion, sinus pressure and sore throat.   Eyes: Negative for photophobia, pain and visual disturbance.  Respiratory: Negative for cough and shortness of breath.   Cardiovascular: Negative for chest pain.  Gastrointestinal: Negative for abdominal pain, nausea and vomiting.  Genitourinary: Negative for decreased urine volume and hematuria.  Musculoskeletal: Negative for myalgias, neck pain and neck stiffness.  Skin: Positive for wound.  Neurological: Negative for dizziness, syncope, facial asymmetry, speech difficulty, weakness, light-headedness, numbness and headaches.     Physical Exam Triage Vital Signs ED Triage Vitals [08/11/18 1807]  Enc Vitals Group     BP      Pulse      Resp      Temp       Temp src      SpO2      Weight 223 lb (101.2 kg)     Height      Head Circumference      Peak Flow      Pain Score      Pain Loc      Pain Edu?      Excl. in [place]?    No data found.  Updated Vital Signs Wt 223 lb (101.2 kg)   BMI 33.91 kg/m   Visual Acuity Right Eye Distance:   Left Eye Distance:   Bilateral Distance:    Right Eye Near:   Left Eye Near:    Bilateral Near:     Physical Exam  Constitutional: She is oriented to person, place, and time. She appears well-developed and well-nourished. No distress.  HENT:  Head: Normocephalic and atraumatic.  Mouth/Throat: Oropharynx is clear and moist.  10.  And 23, left side of symptoms are teeth fractured, appears to have nerve exposed, upper tooth with bruising to gingiva  Eyes: Pupils are equal, round, and reactive to light. Conjunctivae and EOM are normal.  Neck: Neck supple.  Cardiovascular: Normal rate and regular rhythm.  No murmur heard. Pulmonary/Chest: Effort normal and breath sounds normal. No respiratory distress.  Abdominal: Soft. There is no tenderness.  Musculoskeletal: She exhibits no edema.  Neurological: She is alert and oriented to person, place, and time. She displays normal reflexes. No cranial nerve deficit. Coordination normal.  Skin: Skin is warm and dry.  Superficial abrasion/bruising to left maxillary area, no periorbital swelling  Psychiatric: She has a normal mood and affect.  Nursing note and vitals reviewed.    UC Treatments / Results  Labs (all labs ordered are listed, but only abnormal results are displayed) Labs Reviewed - No data to display  EKG None  Radiology No results found.  Procedures Procedures (including critical care time)  Medications Ordered in UC Medications - No data to display  Initial Impression / Assessment and Plan / UC Course  I have reviewed the triage vital signs and the nursing notes.  Pertinent labs & imaging results that were available during my  care of the patient were reviewed by me and considered in my medical decision making (see chart for details).     Patient with dental fracture to upper and lower jaw, not concerned about infection at this time.  Will treat pain, Tylenol and ibuprofen for mild to moderate pain, pain during the day.  Did provide patient with 2 days worth of hydrocodone to use for severe pain or nighttime pain  given fall and exposure of nerve root and teeth.  Provided patient with dental resources to follow-up with for further treatment of these teeth.  Discussed monitoring for development of infection.  Vital signs stable, no neuro deficits, injury/fall concerning for intracranial pathology.,  But discussed to continue to monitor for development of new signs or symptoms.  Discussed strict return precautions. Patient verbalized understanding and is agreeable with plan.  Final Clinical Impressions(s) / UC Diagnoses   Final diagnoses:  Closed fracture of tooth, initial encounter  Pain, dental     Discharge Instructions     Please use dental resource to contact offices to seek permenant treatment/relief.   For pain please take 600mg -800mg  of Ibuprofen every 8 hours, take with 1000 mg of Tylenol Extra strength every 8 hours. These are safe to take together. Please take with food.   I have also provided 2 days worth of stronger pain medication. This should only be used for severe pain. Do not drive or operate machinery while taking this medication.   Please return if you start to experience significant swelling of your face, experiencing fever.   ED Prescriptions    Medication Sig Dispense Auth. Provider   HYDROcodone-acetaminophen (NORCO/VICODIN) 5-325 MG tablet Take 1 tablet by mouth every 6 (six) hours as needed. 10 tablet Vonette Grosso C, PA-C   ibuprofen (ADVIL,MOTRIN) 800 MG tablet Take 1 tablet (800 mg total) by mouth 3 (three) times daily. 21 tablet Rayansh Herbst, [place] C, PA-C     Controlled Substance  Prescriptions [place] Controlled Substance Registry consulted? Not Applicable   Janith Lima, [place] 08/11/18 1915

## 2018-08-11 NOTE — ED Triage Notes (Signed)
Pt states she broke two of her teeth. Pt states she tripped over something and fell.

## 2020-04-19 ENCOUNTER — Other Ambulatory Visit: Payer: Self-pay | Admitting: Family Medicine

## 2020-04-19 ENCOUNTER — Encounter: Payer: Self-pay | Admitting: Gastroenterology

## 2020-04-19 DIAGNOSIS — N63 Unspecified lump in unspecified breast: Secondary | ICD-10-CM

## 2020-05-01 ENCOUNTER — Other Ambulatory Visit: Payer: Self-pay | Admitting: Specialist

## 2020-05-02 ENCOUNTER — Other Ambulatory Visit: Payer: Self-pay | Admitting: Nurse Practitioner

## 2020-05-02 DIAGNOSIS — N63 Unspecified lump in unspecified breast: Secondary | ICD-10-CM

## 2020-05-03 ENCOUNTER — Other Ambulatory Visit: Payer: PRIVATE HEALTH INSURANCE

## 2020-05-12 ENCOUNTER — Other Ambulatory Visit: Payer: PRIVATE HEALTH INSURANCE

## 2020-05-16 ENCOUNTER — Ambulatory Visit
Admission: RE | Admit: 2020-05-16 | Discharge: 2020-05-16 | Disposition: A | Discharge status: Home / Self Care | Payer: BC Managed Care – PPO | Source: Ambulatory Visit | Attending: Family Medicine | Admitting: Family Medicine

## 2020-05-16 ENCOUNTER — Other Ambulatory Visit: Payer: Self-pay

## 2020-05-16 ENCOUNTER — Ambulatory Visit: Payer: PRIVATE HEALTH INSURANCE

## 2020-05-16 ENCOUNTER — Other Ambulatory Visit: Payer: Self-pay | Admitting: Nurse Practitioner

## 2020-05-16 DIAGNOSIS — Z1231 Encounter for screening mammogram for malignant neoplasm of breast: Secondary | ICD-10-CM

## 2020-05-16 DIAGNOSIS — N63 Unspecified lump in unspecified breast: Secondary | ICD-10-CM

## 2020-05-30 ENCOUNTER — Other Ambulatory Visit: Payer: Self-pay

## 2020-05-30 ENCOUNTER — Ambulatory Visit (AMBULATORY_SURGERY_CENTER): Payer: Self-pay | Admitting: *Deleted

## 2020-05-30 VITALS — Ht 68.0 in | Wt 222.0 lb

## 2020-05-30 DIAGNOSIS — Z1211 Encounter for screening for malignant neoplasm of colon: Secondary | ICD-10-CM

## 2020-05-30 NOTE — Progress Notes (Signed)

## 2020-06-02 ENCOUNTER — Encounter: Payer: Self-pay | Admitting: Gastroenterology

## 2020-06-12 ENCOUNTER — Other Ambulatory Visit: Payer: Self-pay

## 2020-06-12 ENCOUNTER — Ambulatory Visit (AMBULATORY_SURGERY_CENTER): Payer: BC Managed Care – PPO | Admitting: Gastroenterology

## 2020-06-12 ENCOUNTER — Encounter: Payer: Self-pay | Admitting: Gastroenterology

## 2020-06-12 VITALS — BP 127/67 | HR 49 | Temp 96.6°F | Resp 15 | Ht 67.5 in | Wt 222.0 lb

## 2020-06-12 DIAGNOSIS — Z1211 Encounter for screening for malignant neoplasm of colon: Secondary | ICD-10-CM | POA: Diagnosis present

## 2020-06-12 MED ORDER — SODIUM CHLORIDE 0.9 % IV SOLN: 500.0000 mL | INTRAVENOUS | Status: DC

## 2020-06-12 NOTE — Op Note (Signed)
[facility] Patient Name: Katherine Harvey Procedure Date: 06/12/2020 11:41 AM MRN: [id number] Endoscopist: [place]. Loletha Carrow , MD Age: 59 Referring MD:  Date of Birth: 10-13-1961 Gender: Female Account #: [id number] Procedure:                Colonoscopy Indications:              Screening for colorectal malignant neoplasm, This                            is the patient's first colonoscopy Medicines:                Monitored Anesthesia Care Procedure:                Pre-Anesthesia Assessment:                           - Prior to the procedure, a History and Physical                            was performed, and patient medications and                            allergies were reviewed. The patient's tolerance of                            previous anesthesia was also reviewed. The risks                            and benefits of the procedure and the sedation                            options and risks were discussed with the patient.                            All questions were answered, and informed consent                            was obtained. Prior Anticoagulants: The patient has                            taken no previous anticoagulant or antiplatelet                            agents. ASA Grade Assessment: II - A patient with                            mild systemic disease. After reviewing the risks                            and benefits, the patient was deemed in                            satisfactory condition to undergo the procedure.  After obtaining informed consent, the colonoscope                            was passed under direct vision. Throughout the                            procedure, the patient's blood pressure, pulse, and                            oxygen saturations were monitored continuously. The                            Colonoscope was introduced through the anus and                            advanced to the the cecum,  identified by                            appendiceal orifice and ileocecal valve. The                            colonoscopy was performed without difficulty. The                            patient tolerated the procedure well. The quality                            of the bowel preparation was good. The ileocecal                            valve, appendiceal orifice, and rectum were                            photographed. The bowel preparation used was                            Miralax. Scope In: 11:50:20 AM Scope Out: 12:06:52 PM Scope Withdrawal Time: 0 hours 11 minutes 12 seconds  Total Procedure Duration: 0 hours 16 minutes 32 seconds  Findings:                 The perianal and digital rectal examinations were                            normal.                           Diverticula were found in the left colon and right                            colon.                           The exam was otherwise without abnormality on  direct and retroflexion views. Complications:            No immediate complications. Estimated Blood Loss:     Estimated blood loss: none. Impression:               - Diverticulosis in the left colon and in the right                            colon.                           - The examination was otherwise normal on direct                            and retroflexion views.                           - No specimens collected. Recommendation:           - Patient has a contact number available for                            emergencies. The signs and symptoms of potential                            delayed complications were discussed with the                            patient. Return to normal activities tomorrow.                            Written discharge instructions were provided to the                            patient.                           - Resume previous diet.                           - Continue present  medications.                           - Repeat colonoscopy in 10 years for screening                            purposes. Virat Prather L. Loletha Carrow, MD 06/12/2020 12:10:15 PM This report has been signed electronically.

## 2020-06-12 NOTE — Patient Instructions (Signed)
Discharge instructions given. Handout on Diverticulosis. Resume previous medications. YOU HAD AN ENDOSCOPIC PROCEDURE TODAY AT THE [facility]:   Refer to the procedure report that was given to you for any specific questions about what was found during the examination.  If the procedure report does not answer your questions, please call your gastroenterologist to clarify.  If you requested that your care partner not be given the details of your procedure findings, then the procedure report has been included in a sealed envelope for you to review at your convenience later.  YOU SHOULD EXPECT: Some feelings of bloating in the abdomen. Passage of more gas than usual.  Walking can help get rid of the air that was put into your GI tract during the procedure and reduce the bloating. If you had a lower endoscopy (such as a colonoscopy or flexible sigmoidoscopy) you may notice spotting of blood in your stool or on the toilet paper. If you underwent a bowel prep for your procedure, you may not have a normal bowel movement for a few days.  Please Note:  You might notice some irritation and congestion in your nose or some drainage.  This is from the oxygen used during your procedure.  There is no need for concern and it should clear up in a day or so.  SYMPTOMS TO REPORT IMMEDIATELY:  Following lower endoscopy (colonoscopy or flexible sigmoidoscopy):  Excessive amounts of blood in the stool  Significant tenderness or worsening of abdominal pains  Swelling of the abdomen that is new, acute  Fever of 100F or higher   For urgent or emergent issues, a gastroenterologist can be reached at any hour by calling [phone]. Do not use MyChart messaging for urgent concerns.    DIET:  We do recommend a small meal at first, but then you may proceed to your regular diet.  Drink plenty of fluids but you should avoid alcoholic beverages for 24 hours.  ACTIVITY:  You should plan to take it easy for  the rest of today and you should NOT DRIVE or use heavy machinery until tomorrow (because of the sedation medicines used during the test).    FOLLOW UP: Our staff will call the number listed on your records 48-72 hours following your procedure to check on you and address any questions or concerns that you may have regarding the information given to you following your procedure. If we do not reach you, we will leave a message.  We will attempt to reach you two times.  During this call, we will ask if you have developed any symptoms of COVID 19. If you develop any symptoms (ie: fever, flu-like symptoms, shortness of breath, cough etc.) before then, please call [phone].  If you test positive for Covid 19 in the 2 weeks post procedure, please call and report this information to us.    If any biopsies were taken you will be contacted by phone or by letter within the next 1-3 weeks.  Please call us at [phone] if you have not heard about the biopsies in 3 weeks.    SIGNATURES/CONFIDENTIALITY: You and/or your care partner have signed paperwork which will be entered into your electronic medical record.  These signatures attest to the fact that that the information above on your After Visit Summary has been reviewed and is understood.  Full responsibility of the confidentiality of this discharge information lies with you and/or your care-partner.  

## 2020-06-12 NOTE — Progress Notes (Signed)
Vitals by CW 

## 2020-06-12 NOTE — Progress Notes (Signed)
A and O x3. Report to RN. Tolerated MAC anesthesia well.

## 2020-06-14 ENCOUNTER — Telehealth: Payer: Self-pay

## 2020-06-14 NOTE — Telephone Encounter (Signed)
Left message on 2nd follow up call. 

## 2020-06-14 NOTE — Telephone Encounter (Signed)
Left message on follow up call. 

## 2021-03-28 ENCOUNTER — Emergency Department (HOSPITAL_COMMUNITY)
Admission: EM | Admit: 2021-03-28 | Discharge: 2021-03-29 | Disposition: A | Discharge status: Home / Self Care | Payer: No Typology Code available for payment source | Attending: Emergency Medicine | Admitting: Emergency Medicine

## 2021-03-28 ENCOUNTER — Emergency Department (HOSPITAL_COMMUNITY): Payer: No Typology Code available for payment source

## 2021-03-28 ENCOUNTER — Other Ambulatory Visit: Payer: Self-pay

## 2021-03-28 DIAGNOSIS — Z5321 Procedure and treatment not carried out due to patient leaving prior to being seen by health care provider: Secondary | ICD-10-CM | POA: Insufficient documentation

## 2021-03-28 DIAGNOSIS — W1830XA Fall on same level, unspecified, initial encounter: Secondary | ICD-10-CM | POA: Diagnosis not present

## 2021-03-28 DIAGNOSIS — Y990 Civilian activity done for income or pay: Secondary | ICD-10-CM | POA: Insufficient documentation

## 2021-03-28 DIAGNOSIS — M25551 Pain in right hip: Secondary | ICD-10-CM | POA: Diagnosis not present

## 2021-03-28 NOTE — ED Provider Notes (Signed)
MSE was initiated and I personally evaluated the patient and placed orders (if any) at  10:14 PM on Mar 28, 2021.  Patient with fall from standing.  Landed on right hip.    A&Ox4 Walking and talking  Discussed with patient that their care has been initiated.   They are counseled that they will need to remain in the ED until the completion of their workup, including full H&P and results of any tests.  Risks of leaving the emergency department prior to completion of treatment were discussed. Patient was advised to inform ED staff if they are leaving before their treatment is complete. The patient acknowledged these risks and time was allowed for questions.    The patient appears stable so that the remainder of the MSE may be completed by another provider.    Montine Circle, PA-C 03/28/21 2215    Malvin Johns, MD 03/28/21 [phone]

## 2021-03-28 NOTE — ED Triage Notes (Signed)
S/p fall on right hip after slipping on dust pain. Denies hitting head, -loc. Complaining of right hip discomfort.

## 2021-03-29 NOTE — ED Notes (Signed)
Called 3x for room placement. Eloped from waiting area.  

## 2022-09-03 ENCOUNTER — Emergency Department (HOSPITAL_COMMUNITY): Payer: BC Managed Care – PPO

## 2022-09-03 ENCOUNTER — Encounter (HOSPITAL_COMMUNITY): Payer: Self-pay

## 2022-09-03 ENCOUNTER — Emergency Department (HOSPITAL_COMMUNITY)
Admission: EM | Admit: 2022-09-03 | Discharge: 2022-09-04 | Disposition: A | Discharge status: Home / Self Care | Payer: BC Managed Care – PPO | Attending: Emergency Medicine | Admitting: Emergency Medicine

## 2022-09-03 DIAGNOSIS — M79642 Pain in left hand: Secondary | ICD-10-CM | POA: Diagnosis not present

## 2022-09-03 DIAGNOSIS — M5030 Other cervical disc degeneration, unspecified cervical region: Secondary | ICD-10-CM

## 2022-09-03 DIAGNOSIS — S161XXA Strain of muscle, fascia and tendon at neck level, initial encounter: Secondary | ICD-10-CM | POA: Diagnosis not present

## 2022-09-03 DIAGNOSIS — M5412 Radiculopathy, cervical region: Secondary | ICD-10-CM | POA: Diagnosis not present

## 2022-09-03 DIAGNOSIS — Y92410 Unspecified street and highway as the place of occurrence of the external cause: Secondary | ICD-10-CM | POA: Diagnosis not present

## 2022-09-03 DIAGNOSIS — R930 Abnormal findings on diagnostic imaging of skull and head, not elsewhere classified: Secondary | ICD-10-CM | POA: Insufficient documentation

## 2022-09-03 DIAGNOSIS — S199XXA Unspecified injury of neck, initial encounter: Secondary | ICD-10-CM | POA: Diagnosis present

## 2022-09-03 DIAGNOSIS — M25511 Pain in right shoulder: Secondary | ICD-10-CM | POA: Insufficient documentation

## 2022-09-03 DIAGNOSIS — R29898 Other symptoms and signs involving the musculoskeletal system: Secondary | ICD-10-CM

## 2022-09-03 MED ORDER — KETOROLAC TROMETHAMINE 60 MG/2ML IM SOLN
30.0000 mg | Freq: Once | INTRAMUSCULAR | Status: AC
  Administered 2022-09-03: 30 mg via INTRAMUSCULAR
  Filled 2022-09-03: qty 2

## 2022-09-03 MED ORDER — LORAZEPAM 0.5 MG PO TABS: 0.5000 mg | ORAL_TABLET | ORAL | Status: DC | PRN

## 2022-09-03 MED ORDER — LIDOCAINE 5 % EX PTCH: 1.0000 | MEDICATED_PATCH | CUTANEOUS | 0 refills | Status: AC

## 2022-09-03 MED ORDER — HYDROCODONE-ACETAMINOPHEN 5-325 MG PO TABS
1.0000 | ORAL_TABLET | Freq: Once | ORAL | Status: AC
  Administered 2022-09-03: 1 via ORAL
  Filled 2022-09-03: qty 1

## 2022-09-03 MED ORDER — HYDROCODONE-ACETAMINOPHEN 5-325 MG PO TABS: 1.0000 | ORAL_TABLET | Freq: Once | ORAL | Status: DC

## 2022-09-03 MED ORDER — LORAZEPAM 2 MG/ML IJ SOLN: 1.0000 mg | INTRAMUSCULAR | Status: DC | PRN

## 2022-09-03 MED ORDER — HYDROCODONE-ACETAMINOPHEN 5-325 MG PO TABS: 2.0000 | ORAL_TABLET | ORAL | 0 refills | Status: AC | PRN

## 2022-09-03 MED ORDER — LIDOCAINE 5 % EX PTCH
1.0000 | MEDICATED_PATCH | CUTANEOUS | Status: DC
  Administered 2022-09-03: 1 via TRANSDERMAL
  Filled 2022-09-03: qty 1

## 2022-09-03 MED ORDER — LIDOCAINE 5 % EX PTCH: 1.0000 | MEDICATED_PATCH | CUTANEOUS | 0 refills | Status: DC

## 2022-09-03 MED ORDER — LORAZEPAM 2 MG/ML IJ SOLN: 0.5000 mg | INTRAMUSCULAR | Status: DC | PRN
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
## 2022-09-03 NOTE — Discharge Instructions (Addendum)
Please follow-up with neurosurgery regarding your degenerative disc disease. Take Tylenol, Motrin for pain control. Follow-up with sports medicine regarding your left hand, which has been placed in a splint due to concern for scaphoid fracture.
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
## 2022-09-04 ENCOUNTER — Telehealth (HOSPITAL_COMMUNITY): Payer: Self-pay | Admitting: Emergency Medicine

## 2022-09-04 ENCOUNTER — Other Ambulatory Visit (HOSPITAL_COMMUNITY): Payer: Self-pay

## 2022-09-04 MED ORDER — OXYCODONE-ACETAMINOPHEN 5-325 MG PO TABS
1.0000 | ORAL_TABLET | Freq: Four times a day (QID) | ORAL | 0 refills | Status: AC | PRN
  Filled 2022-09-04: qty 15, 4d supply, fill #0

## 2022-09-04 NOTE — Telephone Encounter (Signed)
Patient requires prescription for Percocet due to pharmacy not having hydrocodone

## 2022-09-25 ENCOUNTER — Encounter (HOSPITAL_COMMUNITY): Payer: Self-pay

## 2022-09-25 ENCOUNTER — Other Ambulatory Visit: Payer: Self-pay

## 2022-09-25 ENCOUNTER — Emergency Department (HOSPITAL_COMMUNITY): Payer: BC Managed Care – PPO

## 2022-09-25 ENCOUNTER — Emergency Department (HOSPITAL_COMMUNITY)
Admission: EM | Admit: 2022-09-25 | Discharge: 2022-09-25 | Disposition: A | Discharge status: Home / Self Care | Payer: BC Managed Care – PPO | Attending: Emergency Medicine | Admitting: Emergency Medicine

## 2022-09-25 DIAGNOSIS — M79641 Pain in right hand: Secondary | ICD-10-CM | POA: Diagnosis not present

## 2022-09-25 MED ORDER — MELOXICAM 7.5 MG PO TABS: 7.5000 mg | ORAL_TABLET | Freq: Every day | ORAL | 0 refills | Status: AC

## 2022-09-25 MED ORDER — IBUPROFEN 800 MG PO TABS
800.0000 mg | ORAL_TABLET | Freq: Once | ORAL | Status: AC
  Administered 2022-09-25: 800 mg via ORAL
  Filled 2022-09-25: qty 1

## 2022-09-25 MED ORDER — HYDROCODONE-ACETAMINOPHEN 5-325 MG PO TABS
1.0000 | ORAL_TABLET | Freq: Once | ORAL | Status: AC
  Administered 2022-09-25: 1 via ORAL
  Filled 2022-09-25: qty 1
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
## 2022-09-25 NOTE — Discharge Instructions (Signed)
You were seen in the emergency room today with hand pain.  See osteoarthritis on your x-ray.  Please call your primary care physician.  I am starting you on meloxicam which is similar to ibuprofen.  Do not take extra ibuprofen while on this medicine but she may take Tylenol.
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
